# Patient Record
Sex: Female | Born: 1945 | Race: White | Hispanic: No | Marital: Married | Smoking: Never smoker
Health system: Southern US, Community
[De-identification: ages and names within clinical notes are randomized; demographics above are authoritative.]

## PROBLEM LIST (undated history)

## (undated) DIAGNOSIS — L570 Actinic keratosis: Secondary | ICD-10-CM

## (undated) DIAGNOSIS — I10 Essential (primary) hypertension: Secondary | ICD-10-CM

## (undated) DIAGNOSIS — K219 Gastro-esophageal reflux disease without esophagitis: Secondary | ICD-10-CM

## (undated) DIAGNOSIS — F419 Anxiety disorder, unspecified: Secondary | ICD-10-CM

## (undated) DIAGNOSIS — M810 Age-related osteoporosis without current pathological fracture: Secondary | ICD-10-CM

## (undated) DIAGNOSIS — M1990 Unspecified osteoarthritis, unspecified site: Secondary | ICD-10-CM

## (undated) HISTORY — PX: BREAST BIOPSY: SHX20

## (undated) HISTORY — DX: Essential (primary) hypertension: I10

## (undated) HISTORY — PX: COLONOSCOPY: SHX174

## (undated) HISTORY — DX: Actinic keratosis: L57.0

## (undated) HISTORY — PX: ABDOMINAL HYSTERECTOMY: SHX81

---

## 2007-11-14 ENCOUNTER — Ambulatory Visit: Payer: Self-pay | Admitting: Unknown Physician Specialty

## 2007-11-22 ENCOUNTER — Other Ambulatory Visit: Payer: Self-pay

## 2007-11-22 ENCOUNTER — Observation Stay: Payer: Self-pay | Admitting: Unknown Physician Specialty

## 2008-03-03 ENCOUNTER — Ambulatory Visit: Payer: Self-pay | Admitting: Urology

## 2008-05-08 ENCOUNTER — Ambulatory Visit: Payer: Self-pay | Admitting: Internal Medicine

## 2008-08-05 ENCOUNTER — Ambulatory Visit: Payer: Self-pay | Admitting: Unknown Physician Specialty

## 2008-12-22 ENCOUNTER — Ambulatory Visit: Payer: Self-pay | Admitting: Internal Medicine

## 2010-02-15 ENCOUNTER — Ambulatory Visit: Payer: Self-pay

## 2011-02-17 ENCOUNTER — Ambulatory Visit: Payer: Self-pay | Admitting: Nurse Practitioner

## 2011-06-28 ENCOUNTER — Ambulatory Visit: Payer: Self-pay | Admitting: Internal Medicine

## 2011-11-14 ENCOUNTER — Ambulatory Visit: Payer: Self-pay | Admitting: Unknown Physician Specialty

## 2012-02-22 ENCOUNTER — Ambulatory Visit: Payer: Self-pay | Admitting: Nurse Practitioner

## 2013-02-22 ENCOUNTER — Ambulatory Visit: Payer: Self-pay

## 2013-07-18 ENCOUNTER — Telehealth: Payer: Self-pay | Admitting: *Deleted

## 2013-07-18 NOTE — Telephone Encounter (Signed)
SHELLY SPOKE WITH PT THIS AM RE: HER ORTHOTICS. SHE IS STATING SHE WANTS THEM NOW OR SEE A DIFFERENT DOCTOR TO PICK THEM UP. IT IS HER 1ST PAIR AND SHE DOES HAVE AN APPT WITH YOU ON 12.4.14. WOULD YOU LIKE FOR HER TO WAIT ON YOU OR SEE A DIFF DOCTOR TO PICK THEM UP OR JUST COME IN TO PICK THEM UP WITHOUT SEEING ANYONE?

## 2013-07-19 NOTE — Telephone Encounter (Signed)
L/m on v/m letting pt know per dr Irving Shows can come by and pick up orthotics or if need an appt today would have to see dr.regal for dispense of orthotics

## 2013-07-30 ENCOUNTER — Encounter: Payer: Self-pay | Admitting: Podiatrist

## 2013-08-15 ENCOUNTER — Ambulatory Visit: Payer: Self-pay | Admitting: Podiatrist

## 2013-12-05 ENCOUNTER — Ambulatory Visit: Payer: Self-pay | Admitting: Surgery

## 2013-12-05 DIAGNOSIS — I10 Essential (primary) hypertension: Secondary | ICD-10-CM

## 2013-12-17 ENCOUNTER — Ambulatory Visit: Payer: Self-pay | Admitting: Surgery

## 2014-03-27 ENCOUNTER — Ambulatory Visit: Payer: Self-pay | Admitting: Nurse Practitioner

## 2014-12-27 NOTE — Op Note (Signed)
PATIENT NAME:  Gabriela Cohen, Gabriela Cohen MR#:  [id number] DATE OF BIRTH:  Oct 24, 1945  DATE OF PROCEDURE:  12/17/2013  PREOPERATIVE DIAGNOSIS: Left inguinal hernia.   POSTOPERATIVE DIAGNOSIS: Left inguinal hernia.   PROCEDURE: Left inguinal hernia repair.   SURGEON: Rochel Brome, M.D.   ANESTHESIA: General.   INDICATIONS: This 69 year old female has had bulging in the left groin. A left inguinal hernia was demonstrated on physical exam and repair was recommended for definitive treatment.   DESCRIPTION OF PROCEDURE: The patient was placed on the operating table in the supine position under general anesthesia. The abdomen was clipped and prepared with ChloraPrep and draped in a sterile manner. A left lower quadrant suprapubic transversely oriented incision was made, carried down through subcutaneous tissues. Two traversing veins were divided between 4-0 chromic suture ligatures and the Scarpa's fascia was incised. Several small bleeding points were cauterized. The external oblique aponeurosis was incised along the course of its fibers to reveal an indirect hernia sac, which was somewhat fatty and was dissected free from surrounding structures. There was a remnant of a round ligament, divided. The hernia sac was dissected up through the internal ring. It was opened. Its continuity with the peritoneal cavity was demonstrated. A high ligation of the sac was done with a 0 Surgilon suture ligature. The sac was excised and did not need to be sent for pathology. The stump was allowed to retract. The repair was carried out with a row of 0 Surgilon sutures, suturing the conjoined tendon to the shelving edge of the inguinal ligament, obliterating the defect. Next, an onlay Bard soft mesh was cut to create an oval shape of some 2.8 x 3.5 cm and was placed directly over the repair and sutured to the fascia with 0 Surgilon. The repair looked good. Hemostasis was intact. The external oblique aponeurosis was closed with a  running 4-0 Vicryl suture to cover the mesh.   Next, the deep fascia superior and lateral to the repair site was infiltrated with 15 mL of 0.5% Sensorcaine with epinephrine. Subcutaneous tissues were infiltrated with 5 mL. Next, the Scarpa's fascia was closed with interrupted 4-0 Vicryl. The skin was closed with running 4-0 Monocryl subcuticular suture and Dermabond. The patient tolerated surgery satisfactorily and was prepared for transfer to the recovery room. ____________________________ Lenna Sciara. Rochel Brome, MD jws:aw D: 12/17/2013 08:42:30 ET T: 12/17/2013 08:57:59 ET JOB#: [id number]  cc: Loreli Dollar, MD, <Dictator> Loreli Dollar MD ELECTRONICALLY SIGNED 12/17/2013 18:28

## 2015-03-20 ENCOUNTER — Other Ambulatory Visit: Payer: Self-pay | Admitting: Nurse Practitioner

## 2015-03-20 DIAGNOSIS — Z1231 Encounter for screening mammogram for malignant neoplasm of breast: Secondary | ICD-10-CM

## 2015-03-24 ENCOUNTER — Ambulatory Visit: Admission: RE | Admit: 2015-03-24 | Payer: Medicare Other | Source: Ambulatory Visit

## 2015-04-07 ENCOUNTER — Ambulatory Visit
Admission: RE | Admit: 2015-04-07 | Discharge: 2015-04-07 | Disposition: A | Discharge status: Home / Self Care | Payer: Medicare Other | Source: Ambulatory Visit | Attending: Nurse Practitioner | Admitting: Nurse Practitioner

## 2015-04-07 DIAGNOSIS — Z1231 Encounter for screening mammogram for malignant neoplasm of breast: Secondary | ICD-10-CM | POA: Insufficient documentation

## 2016-04-11 ENCOUNTER — Other Ambulatory Visit: Payer: Self-pay | Admitting: Nurse Practitioner

## 2016-04-11 DIAGNOSIS — Z1231 Encounter for screening mammogram for malignant neoplasm of breast: Secondary | ICD-10-CM

## 2016-04-14 ENCOUNTER — Other Ambulatory Visit: Payer: Self-pay | Admitting: Nurse Practitioner

## 2016-04-14 ENCOUNTER — Ambulatory Visit
Admission: RE | Admit: 2016-04-14 | Discharge: 2016-04-14 | Disposition: A | Discharge status: Home / Self Care | Payer: Medicare Other | Source: Ambulatory Visit | Attending: Nurse Practitioner | Admitting: Nurse Practitioner

## 2016-04-14 DIAGNOSIS — Z1231 Encounter for screening mammogram for malignant neoplasm of breast: Secondary | ICD-10-CM | POA: Insufficient documentation

## 2017-03-22 ENCOUNTER — Other Ambulatory Visit: Payer: Self-pay | Admitting: Nurse Practitioner

## 2017-03-22 DIAGNOSIS — Z1231 Encounter for screening mammogram for malignant neoplasm of breast: Secondary | ICD-10-CM

## 2017-04-24 ENCOUNTER — Ambulatory Visit
Admission: RE | Admit: 2017-04-24 | Discharge: 2017-04-24 | Disposition: A | Discharge status: Home / Self Care | Payer: Medicare Other | Source: Ambulatory Visit | Attending: Nurse Practitioner | Admitting: Nurse Practitioner

## 2017-04-24 DIAGNOSIS — Z1231 Encounter for screening mammogram for malignant neoplasm of breast: Secondary | ICD-10-CM | POA: Insufficient documentation

## 2018-03-21 ENCOUNTER — Other Ambulatory Visit: Payer: Self-pay | Admitting: Family Medicine

## 2018-03-21 DIAGNOSIS — Z1231 Encounter for screening mammogram for malignant neoplasm of breast: Secondary | ICD-10-CM

## 2018-04-25 ENCOUNTER — Ambulatory Visit
Admission: RE | Admit: 2018-04-25 | Discharge: 2018-04-25 | Disposition: A | Discharge status: Home / Self Care | Payer: Medicare HMO | Source: Ambulatory Visit | Attending: Family Medicine | Admitting: Family Medicine

## 2018-04-25 ENCOUNTER — Encounter (INDEPENDENT_AMBULATORY_CARE_PROVIDER_SITE_OTHER): Payer: Self-pay

## 2018-04-25 DIAGNOSIS — Z1231 Encounter for screening mammogram for malignant neoplasm of breast: Secondary | ICD-10-CM

## 2018-11-08 ENCOUNTER — Ambulatory Visit: Payer: Self-pay | Admitting: Urology

## 2019-01-21 ENCOUNTER — Encounter: Admission: RE | Payer: Self-pay | Source: Home / Self Care

## 2019-01-21 ENCOUNTER — Ambulatory Visit
Admission: RE | Admit: 2019-01-21 | Payer: Medicare HMO | Source: Home / Self Care | Admitting: Unknown Physician Specialty

## 2019-01-21 SURGERY — COLONOSCOPY WITH PROPOFOL
Anesthesia: General

## 2019-04-10 ENCOUNTER — Other Ambulatory Visit: Payer: Self-pay | Admitting: Family Medicine

## 2019-04-10 DIAGNOSIS — Z1231 Encounter for screening mammogram for malignant neoplasm of breast: Secondary | ICD-10-CM

## 2019-05-06 ENCOUNTER — Encounter (INDEPENDENT_AMBULATORY_CARE_PROVIDER_SITE_OTHER): Payer: Self-pay

## 2019-05-06 ENCOUNTER — Other Ambulatory Visit: Payer: Self-pay

## 2019-05-06 ENCOUNTER — Ambulatory Visit
Admission: RE | Admit: 2019-05-06 | Discharge: 2019-05-06 | Disposition: A | Discharge status: Home / Self Care | Payer: Medicare HMO | Source: Ambulatory Visit | Attending: Family Medicine | Admitting: Family Medicine

## 2019-05-06 DIAGNOSIS — Z1231 Encounter for screening mammogram for malignant neoplasm of breast: Secondary | ICD-10-CM

## 2019-06-12 ENCOUNTER — Encounter: Payer: Self-pay | Admitting: *Deleted

## 2020-04-09 ENCOUNTER — Other Ambulatory Visit: Payer: Self-pay | Admitting: Family Medicine

## 2020-04-09 DIAGNOSIS — Z1231 Encounter for screening mammogram for malignant neoplasm of breast: Secondary | ICD-10-CM

## 2020-04-21 ENCOUNTER — Ambulatory Visit
Payer: Medicare HMO | Attending: Student in an Organized Health Care Education/Training Program | Admitting: Student in an Organized Health Care Education/Training Program

## 2020-04-21 ENCOUNTER — Encounter: Payer: Self-pay | Admitting: Student in an Organized Health Care Education/Training Program

## 2020-04-21 ENCOUNTER — Other Ambulatory Visit: Payer: Self-pay

## 2020-04-21 VITALS — BP 128/76 | HR 78 | Temp 97.4°F | Resp 18 | Ht 65.0 in | Wt 140.0 lb

## 2020-04-21 DIAGNOSIS — M5136 Other intervertebral disc degeneration, lumbar region: Secondary | ICD-10-CM | POA: Insufficient documentation

## 2020-04-21 DIAGNOSIS — G8929 Other chronic pain: Secondary | ICD-10-CM | POA: Insufficient documentation

## 2020-04-21 DIAGNOSIS — M5416 Radiculopathy, lumbar region: Secondary | ICD-10-CM | POA: Insufficient documentation

## 2020-04-21 DIAGNOSIS — G894 Chronic pain syndrome: Secondary | ICD-10-CM | POA: Diagnosis present

## 2020-04-21 DIAGNOSIS — M47816 Spondylosis without myelopathy or radiculopathy, lumbar region: Secondary | ICD-10-CM | POA: Diagnosis present

## 2020-04-21 NOTE — Progress Notes (Signed)
Patient: Gabriela Cohen  Service Category: E/M  Provider: Gillis Santa, MD  DOB: 08/16/1946  DOS: 04/21/2020  Referring Provider: Nadene Rubins, DO  MRN: [id number]  Setting: Ambulatory outpatient  PCP: Alfonse Flavors, MD  Type: New Patient  Specialty: Interventional Pain Management    Location: Office  Delivery: Face-to-face     Primary Reason(s) for Visit: Encounter for initial evaluation of one or more chronic problems (new to examiner) potentially causing chronic pain, and posing a threat to normal musculoskeletal function. (Level of risk: High) CC: Back Pain (low)  HPI  [name] is a 74 y.o. year old, female patient, who comes for the first time to our practice referred by Nadene Rubins, DO [place],  [place] [address], for our initial evaluation of her chronic pain. She has Chronic radicular lumbar pain; Lumbar spondylosis; Lumbar degenerative disc disease; and Chronic pain syndrome on their problem list. Today she comes in for evaluation of her Back Pain (low)  Pain Assessment: Location: Lower Back Radiating: starts at waist and goes up to neck at times Onset: More than a month ago Duration: Chronic pain Quality: Sharp, Aching, Heaviness Severity: 6 /10 (subjective, self-reported pain score)  Effect on ADL: limits activities Timing: Constant Modifying factors: laying down BP: 128/76   HR: 78  Onset and Duration: Present longer than 3 months Cause of pain: Unknown Severity: No change since onset, NAS-11 at its worse: 10/10, NAS-11 at its best: 5/10, NAS-11 now: 6/10 and NAS-11 on the average: 8/10 Timing: Afternoon, Night, Not influenced by the time of the day, During activity or exercise, After activity or exercise and After a period of immobility Aggravating Factors: Bending, Climbing, Kneeling, Lifiting, Motion, Prolonged standing, Squatting, Twisting, Walking, Walking uphill, Walking downhill and Working Alleviating Factors: Lying down,  Medications, Nerve blocks and Resting Associated Problems: Night-time cramps and Fatigue Quality of Pain: Aching, Agonizing, Constant, Disabling, Exhausting, Getting longer, Throbbing, Tiring and Uncomfortable Previous Examinations or Tests: MRI scan, Nerve block and X-rays Previous Treatments: Facet blocks, Steroid treatments by mouth and Trigger point injections  Gabriela Cohen is a pleasant 74 year old female who presents with a chief complaint of low back and mid back pain that radiates superiorly.  This started after she fell last year when she tripped over a dog.  She went to [facility] where she received cortisone injections in her back.  She states that she had x-rays of her thoracic and lumbar spine done that showed severe scoliosis and severe arthritis.  She states that she also had advanced imaging done which showed a pubic symphysis fracture.  She has had what sounds like epidural steroid injections, lumbar facet medial branch nerve blocks followed subsequently by a lumbar radiofrequency ablation that was performed in March 2021 with Dr. Dwain Sarna.  Unfortunately we do not have any records from [facility] pertaining to her imaging studies or procedural history.  She states that she does receive tramadol from [facility] which she takes on a as needed basis when she has severe pain.  Dr. Cristy Folks who is managing her pain at [facility] has moved she would like to transfer her care here.  Patient also endorses intermittent right knee pain for which she has received a right cortisone injection.  Historic Controlled Substance Pharmacotherapy Review   03/23/2020  1   03/23/2020  Tramadol Hcl 50 MG Tablet  50.00  16 [address] [place]   [id number]   [place] (4575)   0/0  15.62 MME  Medicare  [place]     Tramadol 25 to 50 mg daily as needed  Historical Monitoring: The patient  reports no history of drug use. List of all UDS Test(s): No results found for: MDMA, COCAINSCRNUR, [place], [place], CANNABQUANT, THCU,  [place] List of other Serum/Urine Drug Screening Test(s):  No results found for: AMPHSCRSER, BARBSCRSER, BENZOSCRSER, COCAINSCRSER, COCAINSCRNUR, PCPSCRSER, PCPQUANT, THCSCRSER, THCU, CANNABQUANT, OPIATESCRSER, OXYSCRSER, PROPOXSCRSER, ETH Historical Background Evaluation: [place] PMP: PDMP reviewed during this encounter. Online review of the past 2-monthperiod conducted.             [place] Department of public safety, offender search: (Editor, commissioningInformation) Non-contributory Risk Assessment Profile: Aberrant behavior: None observed or detected today Risk factors for fatal opioid overdose: None identified today Fatal overdose hazard ratio (HR): Calculation deferred Non-fatal overdose hazard ratio (HR): Calculation deferred Risk of opioid abuse or dependence: 0.7-3.0% with doses ? 36 MME/day and 6.1-26% with doses ? 120 MME/day. Substance use disorder (SUD) risk level: See below Personal History of Substance Abuse (SUD-Substance use disorder):  Alcohol: Negative  Illegal Drugs: Negative  Rx Drugs: Negative  ORT Risk Level calculation: Low Risk  Opioid Risk Tool - 04/21/20 0857      Family History of Substance Abuse   Alcohol Negative    Illegal Drugs Negative    Rx Drugs Negative      Personal History of Substance Abuse   Alcohol Negative    Illegal Drugs Negative    Rx Drugs Negative      Age   Age between 166-45years  No      History of Preadolescent Sexual Abuse   History of Preadolescent Sexual Abuse Negative or Female      Psychological Disease   Psychological Disease Negative    Depression Negative      Total Score   Opioid Risk Tool Scoring 0    Opioid Risk Interpretation Low Risk          ORT Scoring interpretation table:  Score <3 = Low Risk for SUD  Score between 4-7 = Moderate Risk for SUD  Score >8 = High Risk for Opioid Abuse   PHQ-2 Depression Scale:  Total score: 0  PHQ-2 Scoring interpretation table: (Score and probability of major depressive disorder)  Score 0 =  No depression  Score 1 = 15.4% Probability  Score 2 = 21.1% Probability  Score 3 = 38.4% Probability  Score 4 = 45.5% Probability  Score 5 = 56.4% Probability  Score 6 = 78.6% Probability   PHQ-9 Depression Scale:  Total score: 0  PHQ-9 Scoring interpretation table:  Score 0-4 = No depression  Score 5-9 = Mild depression  Score 10-14 = Moderate depression  Score 15-19 = Moderately severe depression  Score 20-27 = Severe depression (2.4 times higher risk of SUD and 2.89 times higher risk of overuse)   Pharmacologic Plan: As per protocol, I have not taken over any controlled substance management, pending the results of ordered tests and/or consults.            Initial impression: Pending review of available data and ordered tests.  Meds   Current Outpatient Medications:    diclofenac (VOLTAREN) 75 MG EC tablet, Take 75 mg by mouth at bedtime., Disp: , Rfl:    omeprazole (PRILOSEC) 20 MG capsule, Take by mouth., Disp: , Rfl:    valsartan (DIOVAN) 160 MG tablet, Take 160 mg by mouth daily., Disp: , Rfl:    ROS  Cardiovascular: High blood pressure Pulmonary or  Respiratory: Snoring  Neurological: No reported neurological signs or symptoms such as seizures, abnormal skin sensations, urinary and/or fecal incontinence, being born with an abnormal open spine and/or a tethered spinal cord Psychological-Psychiatric: No reported psychological or psychiatric signs or symptoms such as difficulty sleeping, anxiety, depression, delusions or hallucinations (schizophrenial), mood swings (bipolar disorders) or suicidal ideations or attempts Gastrointestinal: No reported gastrointestinal signs or symptoms such as vomiting or evacuating blood, reflux, heartburn, alternating episodes of diarrhea and constipation, inflamed or scarred liver, or pancreas or irrregular and/or infrequent bowel movements Genitourinary: Recurrent Urinary Tract infections Hematological: Brusing easily Endocrine: No reported  endocrine signs or symptoms such as high or low blood sugar, rapid heart rate due to high thyroid levels, obesity or weight gain due to slow thyroid or thyroid disease Rheumatologic: Joint aches and or swelling due to excess weight (Osteoarthritis) and Rheumatoid arthritis Musculoskeletal: Negative for myasthenia gravis, muscular dystrophy, multiple sclerosis or malignant hyperthermia Work History: Working part time  Allergies  Ms. Beth is allergic to oxycodone-acetaminophen and sulfa antibiotics.  Laboratory Chemistry Profile   Renal No results found for: BUN, CREATININE, LABCREA, BCR, GFR, GFRAA, GFRNONAA, SPECGRAV, PHUR, PROTEINUR   Electrolytes No results found for: NA, K, CL, CALCIUM, MG, PHOS   Hepatic No results found for: AST, ALT, ALBUMIN, ALKPHOS, AMYLASE, LIPASE, AMMONIA   ID No results found for: LYMEIGGIGMAB, HIV, SARSCOV2NAA, STAPHAUREUS, MRSAPCR, HCVAB, PREGTESTUR, RMSFIGG, QFVRPH1IGG, QFVRPH2IGG, LYMEIGGIGMAB   Bone No results found for: VD25OH, VO160VP7TGG, YI9485IO2, VO3500XF8, 25OHVITD1, 25OHVITD2, 25OHVITD3, TESTOFREE, TESTOSTERONE   Endocrine No results found for: GLUCOSE, GLUCOSEU, HGBA1C, TSH, FREET4, TESTOFREE, TESTOSTERONE, SHBG, ESTRADIOL, ESTRADIOLPCT, ESTRADIOLFRE, LABPREG, ACTH, CRTSLPL, UCORFRPERLTR, UCORFRPERDAY, CORTISOLBASE, LABPREG   Neuropathy No results found for: VITAMINB12, FOLATE, HGBA1C, HIV   CNS No results found for: COLORCSF, APPEARCSF, RBCCOUNTCSF, WBCCSF, POLYSCSF, LYMPHSCSF, EOSCSF, PROTEINCSF, GLUCCSF, JCVIRUS, CSFOLI, IGGCSF, LABACHR, ACETBL, LABACHR, ACETBL   Inflammation (CRP: Acute   ESR: Chronic) No results found for: CRP, ESRSEDRATE, LATICACIDVEN   Rheumatology No results found for: RF, ANA, LABURIC, URICUR, LYMEIGGIGMAB, LYMEABIGMQN, HLAB27   Coagulation No results found for: INR, LABPROT, APTT, PLT, DDIMER, LABHEMA, VITAMINK1, AT3   Cardiovascular No results found for: BNP, CKTOTAL, CKMB, TROPONINI, HGB, HCT, LABVMA,  EPIRU, EPINEPH24HUR, NOREPRU, NOREPI24HUR, DOPARU, DOPAM24HRUR   Screening No results found for: SARSCOV2NAA, COVIDSOURCE, STAPHAUREUS, MRSAPCR, HCVAB, HIV, PREGTESTUR   Cancer No results found for: CEA, CA125, LABCA2   Allergens No results found for: ALMOND, APPLE, ASPARAGUS, AVOCADO, BANANA, BARLEY, BASIL, B[place]LE[place], GREENBEAN, LIMABEAN, WHITE[place], BEEFIGE, REDBEET, BLUEBERRY, BROCCOLI, CABBAGE, MELON, CARROT, CASEIN, CASHEWNUT, CAULIFLOWER, CELERY     Note: Lab results reviewed.  [place]  Drug: Ms. Franta  reports no history of drug use. Alcohol:  reports no history of alcohol use. Tobacco:  reports that she has never smoked. She has never used smokeless tobacco. Medical:  has a past medical history of Hypertension. Family: family history includes Breast cancer (age of onset: 81) in her maternal aunt and mother.  Past Surgical History:  Procedure Laterality Date   ABDOMINAL HYSTERECTOMY     BREAST BIOPSY Left    neg   Active Ambulatory Problems    Diagnosis Date Noted   Chronic radicular lumbar pain 04/21/2020   Lumbar spondylosis 04/21/2020   Lumbar degenerative disc disease 04/21/2020   Chronic pain syndrome 04/21/2020   Resolved Ambulatory Problems    Diagnosis Date Noted   No Resolved Ambulatory Problems   Past Medical History:  Diagnosis Date   Hypertension    Constitutional Exam  General  appearance: Well nourished, well developed, and well hydrated. In no apparent acute distress Vitals:   04/21/20 0847  BP: 128/76  Pulse: 78  Resp: 18  Temp: (!) 97.4 F (36.3 C)  SpO2: 98%  Weight: 140 lb (63.5 kg)  Height: _0  (1.651 m)   BMI Assessment: Estimated body mass index is 23.3 kg/m as calculated from the following:   Height as of this encounter: _1  (1.651 m).   Weight as of this encounter: 140 lb (63.5 kg).  BMI interpretation table: BMI level Category Range association with higher incidence of chronic pain  <18 kg/m2 Underweight   18.5-24.9  kg/m2 Ideal body weight   25-29.9 kg/m2 Overweight Increased incidence by 20%  30-34.9 kg/m2 Obese (Class I) Increased incidence by 68%  35-39.9 kg/m2 Severe obesity (Class II) Increased incidence by 136%  >40 kg/m2 Extreme obesity (Class III) Increased incidence by 254%   Patient's current BMI Ideal Body weight  Body mass index is 23.3 kg/m. Ideal body weight: 57 kg (125 lb 10.6 oz) Adjusted ideal body weight: 59.6 kg (131 lb 6.4 oz)   BMI Readings from Last 4 Encounters:  04/21/20 23.30 kg/m   Wt Readings from Last 4 Encounters:  04/21/20 140 lb (63.5 kg)    Psych/Mental status: Alert, oriented x 3 (person, place, & time)       Eyes: PERLA Respiratory: No evidence of acute respiratory distress  Cervical Spine Exam  Skin & Axial Inspection: No masses, redness, edema, swelling, or associated skin lesions Alignment: Symmetrical Functional ROM: Unrestricted ROM      Stability: No instability detected Muscle Tone/Strength: Functionally intact. No obvious neuro-muscular anomalies detected. Sensory (Neurological): Unimpaired Palpation: No palpable anomalies              Upper Extremity (UE) Exam    Side: Right upper extremity  Side: Left upper extremity  Skin & Extremity Inspection: Skin color, temperature, and hair growth are WNL. No peripheral edema or cyanosis. No masses, redness, swelling, asymmetry, or associated skin lesions. No contractures.  Skin & Extremity Inspection: Skin color, temperature, and hair growth are WNL. No peripheral edema or cyanosis. No masses, redness, swelling, asymmetry, or associated skin lesions. No contractures.  Functional ROM: Unrestricted ROM          Functional ROM: Unrestricted ROM          Muscle Tone/Strength: Functionally intact. No obvious neuro-muscular anomalies detected.   Muscle Tone/Strength: Functionally intact. No obvious neuro-muscular anomalies detected.  Sensory (Neurological): Unimpaired          Sensory (Neurological): Unimpaired           Palpation: No palpable anomalies              Palpation: No palpable anomalies              Provocative Test(s):  Phalen's test: deferred Tinel's test: deferred Apley's scratch test (touch opposite shoulder):  Action 1 (Across chest): deferred Action 2 (Overhead): deferred Action 3 (LB reach): deferred   Provocative Test(s):  Phalen's test: deferred Tinel's test: deferred Apley's scratch test (touch opposite shoulder):  Action 1 (Across chest): deferred Action 2 (Overhead): deferred Action 3 (LB reach): deferred    Thoracic Spine Area Exam  Skin & Axial Inspection: No masses, redness, or swelling Alignment: Symmetrical Functional ROM: Unrestricted ROM Stability: No instability detected Muscle Tone/Strength: Functionally intact. No obvious neuro-muscular anomalies detected. Sensory (Neurological): Unimpaired Muscle strength & Tone: No palpable anomalies  Lumbar Exam  Skin &  Axial Inspection: No masses, redness, or swelling Alignment: Symmetrical Functional ROM: Pain restricted ROM       Stability: No instability detected Muscle Tone/Strength: Functionally intact. No obvious neuro-muscular anomalies detected. Sensory (Neurological): Musculoskeletal pain pattern Palpation: No palpable anomalies       Provocative Tests: Hyperextension/rotation test: (+) bilaterally for facet joint pain. Lumbar quadrant test (Kemp's test): (+) bilaterally for facet joint pain. Lateral bending test: deferred today       Patrick's Maneuver: (+) for bilateral S-I arthralgia             FABER* test: (+) for bilateral S-I arthralgia             S-I anterior distraction/compression test: deferred today         S-I lateral compression test: deferred today         S-I Thigh-thrust test: deferred today         S-I Gaenslen's test: deferred today         *(Flexion, ABduction and External Rotation)  Gait & Posture Assessment  Ambulation: Unassisted Gait: Relatively normal for age and body  habitus Posture: WNL   Lower Extremity Exam    Side: Right lower extremity  Side: Left lower extremity  Stability: No instability observed          Stability: No instability observed          Skin & Extremity Inspection: Skin color, temperature, and hair growth are WNL. No peripheral edema or cyanosis. No masses, redness, swelling, asymmetry, or associated skin lesions. No contractures.  Skin & Extremity Inspection: Skin color, temperature, and hair growth are WNL. No peripheral edema or cyanosis. No masses, redness, swelling, asymmetry, or associated skin lesions. No contractures.  Functional ROM: Pain restricted ROM for knee joint          Functional ROM: Unrestricted ROM                  Muscle Tone/Strength: Functionally intact. No obvious neuro-muscular anomalies detected.  Muscle Tone/Strength: Functionally intact. No obvious neuro-muscular anomalies detected.  Sensory (Neurological): Arthropathic arthralgia        Sensory (Neurological): Unimpaired        DTR: Patellar: deferred today Achilles: deferred today Plantar: deferred today  DTR: Patellar: deferred today Achilles: deferred today Plantar: deferred today  Palpation: No palpable anomalies  Palpation: No palpable anomalies   Assessment  Primary Diagnosis & Pertinent Problem List: The primary encounter diagnosis was Lumbar radiculopathy. Diagnoses of Chronic radicular lumbar pain, Lumbar facet arthropathy, Lumbar spondylosis, Lumbar degenerative disc disease, and Chronic pain syndrome were also pertinent to this visit.  Visit Diagnosis (New problems to examiner): 1. Lumbar radiculopathy   2. Chronic radicular lumbar pain   3. Lumbar facet arthropathy   4. Lumbar spondylosis   5. Lumbar degenerative disc disease   6. Chronic pain syndrome      Plan of Care (Initial workup plan)   1.  We will obtain records from [facility] specifically imaging studies including x-rays and MRI as well as procedural history 2.  Urine  toxicology screen as below which is customary for new patients.  We will forego psych assessment as patient is on low-dose intermittent tramadol.  Can take over patient's tramadol at next visit, 50 mg daily as needed, quantity 30/month 3.  Pending imaging studies and procedural history can consider facet medial branch nerve blocks and repeat lumbar radiofrequency ablation.  Can also consider sprint peripheral nerve stimulation of lumbar medial branch 4.  Consider right knee intra-articular Synvisc versus genicular nerve block for right knee pain. 5.  In regards to medication management, I recommend patient decrease her diclofenac intake as she has been taking this daily.  Recommend that she utilize extra strength Tylenol 500 mg every 6 hours as needed.   Lab Orders     Compliance Drug Analysis, Ur     Interventional management options: Ms. Hlavac was informed that there is no guarantee that she would be a candidate for interventional therapies. The decision will be based on the results of diagnostic studies, as well as Ms. Tartaglia's risk profile.  Procedure(s) under consideration:  Repeat lumbar radiofrequency ablation Lumbar Sprint medial branch peripheral nerve stimulation SI joint injection, diagnostic Right knee steroid injection, genicular nerve block, viscosupplementation   Provider-requested follow-up: Return in about 2 weeks (around 05/05/2020) for Medication Management (get records from [facility]).  Future Appointments  Date Time Provider Patrick  05/06/2020  9:20 AM [facility] MM DEXA [facility]  05/26/2020 11:40 AM Gillis Santa, MD [facility] None    Note by: Gillis Santa, MD Date: 04/21/2020; Time: 10:12 AM

## 2020-04-21 NOTE — Patient Instructions (Addendum)
1. It is very important that we get your records from [facility]. Our staff can help you with this. We need records specifically xrays, MRIs, and any procedures that you had done including your ablation in [facility]   Pain Management Discharge Instructions  General Discharge Instructions :  If you need to reach your doctor call: Monday-Friday 8:00 am - 4:00 pm at [phone] or toll free [phone].  After clinic hours [phone] to have operator reach doctor.  Bring all of your medication bottles to all your appointments in the pain clinic.  To cancel or reschedule your appointment with Pain Management please remember to call 24 hours in advance to avoid a fee.  Refer to the educational materials which you have been given on: General Risks, I had my Procedure. Discharge Instructions, Post Sedation.  Post Procedure Instructions:  The drugs you were given will stay in your system until tomorrow, so for the next 24 hours you should not drive, make any legal decisions or drink any alcoholic beverages.  You may eat anything you prefer, but it is better to start with liquids then soups and crackers, and gradually work up to solid foods.  Please notify your doctor immediately if you have any unusual bleeding, trouble breathing or pain that is not related to your normal pain.  Depending on the type of procedure that was done, some parts of your body may feel week and/or numb.  This usually clears up by tonight or the next day.  Walk with the use of an assistive device or accompanied by an adult for the 24 hours.  You may use ice on the affected area for the first 24 hours.  Put ice in a Ziploc bag and cover with a towel and place against area 15 minutes on 15 minutes off.  You may switch to heat after 24 hours.

## 2020-04-21 NOTE — Progress Notes (Signed)
Safety precautions to be maintained throughout the outpatient stay will include: orient to surroundings, keep bed in low position, maintain call bell within reach at all times, provide assistance with transfer out of bed and ambulation.  

## 2020-04-24 LAB — COMPLIANCE DRUG ANALYSIS, UR

## 2020-04-30 ENCOUNTER — Ambulatory Visit: Payer: Medicare HMO | Admitting: Student in an Organized Health Care Education/Training Program

## 2020-05-05 ENCOUNTER — Encounter: Payer: Self-pay | Admitting: Student in an Organized Health Care Education/Training Program

## 2020-05-05 ENCOUNTER — Other Ambulatory Visit: Payer: Self-pay

## 2020-05-05 ENCOUNTER — Ambulatory Visit
Payer: Medicare HMO | Attending: Student in an Organized Health Care Education/Training Program | Admitting: Student in an Organized Health Care Education/Training Program

## 2020-05-05 VITALS — BP 150/65 | HR 74 | Temp 97.1°F | Resp 16 | Ht 65.0 in | Wt 140.0 lb

## 2020-05-05 DIAGNOSIS — M47816 Spondylosis without myelopathy or radiculopathy, lumbar region: Secondary | ICD-10-CM | POA: Diagnosis not present

## 2020-05-05 DIAGNOSIS — M5136 Other intervertebral disc degeneration, lumbar region: Secondary | ICD-10-CM

## 2020-05-05 DIAGNOSIS — G894 Chronic pain syndrome: Secondary | ICD-10-CM | POA: Insufficient documentation

## 2020-05-05 DIAGNOSIS — M170 Bilateral primary osteoarthritis of knee: Secondary | ICD-10-CM | POA: Diagnosis not present

## 2020-05-05 MED ORDER — TRAMADOL HCL 50 MG PO TABS: 50.0000 mg | ORAL_TABLET | Freq: Three times a day (TID) | ORAL | 2 refills | Status: AC | PRN

## 2020-05-05 NOTE — Progress Notes (Signed)
Safety precautions to be maintained throughout the outpatient stay will include: orient to surroundings, keep bed in low position, maintain call bell within reach at all times, provide assistance with transfer out of bed and ambulation.  

## 2020-05-05 NOTE — Patient Instructions (Addendum)
1. Sign pain contract  2. Rx for Tramadol for 3 months  3. Schedule for knee gel injection #1 ASAP 4. Schedule for bilateral L2-L5 RFA in 4-8 weeks 5 Follow up in 3 months for MM   Radiofrequency Lesioning Radiofrequency lesioning is a procedure that is performed to relieve pain. The procedure is often used for back, neck, or arm pain. Radiofrequency lesioning involves the use of a machine that creates radio waves to make heat. During the procedure, the heat is applied to the nerve that carries the pain signal. The heat damages the nerve and interferes with the pain signal. Pain relief usually starts about 2 weeks after the procedure and lasts for 6 months to 1 year. You will be awake during the procedure. You will need to be able to talk with the health care provider during the procedure. Tell a health care provider about:  Any allergies you have.  All medicines you are taking, including vitamins, herbs, eye drops, creams, and over-the-counter medicines.  Any problems you or family members have had with anesthetic medicines.  Any blood disorders you have.  Any surgeries you have had.  Any medical conditions you have or have had.  Whether you are pregnant or may be pregnant. What are the risks? Generally, this is a safe procedure. However, problems may occur, including:  Pain or soreness at the injection site.  Allergic reaction to medicines given during the procedure.  Bleeding.  Infection at the injection site.  Damage to nerves or blood vessels. What happens before the procedure? Staying hydrated Follow instructions from your health care provider about hydration, which may include:  Up to 2 hours before the procedure - you may continue to drink clear liquids, such as water, clear fruit juice, black coffee, and plain tea. Eating and drinking Follow instructions from your health care provider about eating and drinking, which may include:  8 hours before the procedure -  stop eating heavy meals or foods, such as meat, fried foods, or fatty foods.  6 hours before the procedure - stop eating light meals or foods, such as toast or cereal.  6 hours before the procedure - stop drinking milk or drinks that contain milk.  2 hours before the procedure - stop drinking clear liquids. Medicines Ask your health care provider about:  Changing or stopping your regular medicines. This is especially important if you are taking diabetes medicines or blood thinners.  Taking medicines such as aspirin and ibuprofen. These medicines can thin your blood. Do not take these medicines unless your health care provider tells you to take them.  Taking over-the-counter medicines, vitamins, herbs, and supplements. General instructions  Plan to have someone take you home from the hospital or clinic.  If you will be going home right after the procedure, plan to have someone with you for 24 hours.  Ask your health care provider what steps will be taken to help prevent infection. These may include: ? Removing hair at the procedure site. ? Washing skin with a germ-killing soap. ? Taking antibiotic medicine. What happens during the procedure?   An IV will be inserted into one of your veins.  You will be given one or more of the following: ? A medicine to help you relax (sedative). ? A medicine to numb the area (local anesthetic).  Your health care provider will insert a radiofrequency needle into the area to be treated. This is done with the help of a type of X-ray (fluoroscopy).  A  wire that carries the radio waves (electrode) will be put through the radiofrequency needle.  An electrical pulse will be sent through the electrode to verify the correct nerve that is causing your pain. You will feel a tingling sensation, and you may have muscle twitching.  The tissue around the needle tip will be heated by an electric current that comes from the radiofrequency machine. This will  numb the nerves.  The needle will be removed.  A bandage (dressing) will be put on the insertion area. The procedure may vary among health care providers and hospitals. What happens after the procedure?  Your blood pressure, heart rate, breathing rate, and blood oxygen level will be monitored until you leave the hospital or clinic.  Return to your normal activities as told by your health care provider. Ask your health care provider what activities are safe for you.  Do not drive for 24 hours if you were given a sedative during your procedure. Summary  Radiofrequency lesioning is a procedure that is performed to relieve pain. The procedure is often used for back, neck, or arm pain.  Radiofrequency lesioning involves the use of a machine that creates radio waves to make heat.  Plan to have someone take you home from the hospital or clinic. Do not drive for 24 hours if you were given a sedative during your procedure.  Return to your normal activities as told by your health care provider. Ask your health care provider what activities are safe for you. This information is not intended to replace advice given to you by your health care provider. Make sure you discuss any questions you have with your health care provider. Document Revised: 05/10/2018 Document Reviewed: 05/10/2018 Elsevier Patient Education  [place]. Sodium Hyaluronate intra-articular injection What is this medicine? SODIUM HYALURONATE (SOE dee um hye al yoor ON ate) is used to treat pain in the knee due to osteoarthritis. This medicine may be used for other purposes; ask your health care provider or pharmacist if you have questions. COMMON BRAND NAME(S): Amvisc, DUROLANE, Euflexxa, GELSYN-3, Hyalgan, Hymovis, Monovisc, Orthovisc, Supartz, Supartz FX, TriVisc, VISCO What should I tell my health care provider before I take this medicine? They need to know if you have any of these conditions:  bleeding  disorders  glaucoma  infection in the knee joint  skin conditions or sensitivity  skin infection  an unusual allergic reaction to sodium hyaluronate, other medicines, foods, dyes, or preservatives. Different brands of sodium hyaluronate contain different allergens. Some may contain egg. Talk to your doctor about your allergies to make sure that you get the right product.  pregnant or trying to get pregnant  breast-feeding How should I use this medicine? This medicine is for injection into the knee joint. It is given by a health care professional in a hospital or clinic setting. Talk to your pediatrician regarding the use of this medicine in children. Special care may be needed. Overdosage: If you think you have taken too much of this medicine contact a poison control center or emergency room at once. NOTE: This medicine is only for you. Do not share this medicine with others. What if I miss a dose? This does not apply. What may interact with this medicine? Interactions are not expected. This list may not describe all possible interactions. Give your health care provider a list of all the medicines, herbs, non-prescription drugs, or dietary supplements you use. Also tell them if you smoke, drink alcohol, or use illegal drugs.  Some items may interact with your medicine. What should I watch for while using this medicine? Tell your doctor or healthcare professional if your symptoms do not start to get better or if they get worse. If receiving this medicine for osteoarthritis, limit your activity after you receive your injection. Avoid physical activity for 48 hours following your injection to keep your knee from swelling. Do not stand on your feet for more than 1 hour at a time during the first 48 hours following your injection. Ask your doctor or healthcare professional about when you can begin major physical activity again. What side effects may I notice from receiving this medicine? Side  effects that you should report to your doctor or health care professional as soon as possible:  allergic reactions like skin rash, itching or hives, swelling of the face, lips, or tongue  dizziness  facial flushing  pain, tingling, numbness in the hands or feet  vision changes if received this medicine during eye surgery Side effects that usually do not require medical attention (report to your doctor or health care professional if they continue or are bothersome):  back pain  bruising at site where injected  chills  diarrhea  fever  headache  joint pain  joint stiffness  joint swelling  muscle cramps  muscle pain  nausea, vomiting  pain, redness, or irritation at site where injected  weak or tired This list may not describe all possible side effects. Call your doctor for medical advice about side effects. You may report side effects to FDA at 1-800-FDA-1088. Where should I keep my medicine? This drug is given in a hospital or clinic and will not be stored at home. NOTE: This sheet is a summary. It may not cover all possible information. If you have questions about this medicine, talk to your doctor, pharmacist, or health care provider.  2020 Elsevier/Gold Standard (2015-09-24 08:34:51)

## 2020-05-05 NOTE — Progress Notes (Signed)
PROVIDER NOTE: Information contained herein reflects review and annotations entered in association with encounter. Interpretation of such information and data should be left to medically-trained personnel. Information provided to patient can be located elsewhere in the medical record under "Patient Instructions". Document created using STT-dictation technology, any transcriptional errors that may result from process are unintentional.    Patient: Gabriela Cohen  Service Category: E/M  Provider: Gillis Santa, MD  DOB: 08/28/1946  DOS: 05/05/2020  Specialty: Interventional Pain Management  MRN: [id number]  Setting: Ambulatory outpatient  PCP: Alfonse Flavors, MD  Type: Established Patient    Referring Provider: Alfonse Flavors  Location: Office  Delivery: Face-to-face     Primary Reason(s) for Visit: Encounter for evaluation before starting new chronic pain management plan of care (Level of risk: moderate) CC: Back Pain (lower) and Knee Pain (right)  HPI  Gabriela Cohen is a 74 y.o. year old, female patient, who comes today for a follow-up evaluation to review the test results and decide on a treatment plan. She has Chronic radicular lumbar pain; Lumbar facet arthropathy; Lumbar degenerative disc disease; and Chronic pain syndrome on their problem list. Her primarily concern today is the Back Pain (lower) and Knee Pain (right)  Pain Assessment: Location: Lower Back Radiating: numbness in both legs Onset: More than a month ago Duration: Chronic pain Quality: Aching Severity: 7 /10 (subjective, self-reported pain score)  Effect on ADL:   Timing: Constant Modifying factors: lying down BP: (!) 150/65  HR: 74  Gabriela Cohen comes in today for a follow-up visit after her initial evaluation on 04/21/2020. Today we went over the results of her tests. These were explained in "Layman's terms". During today's appointment we went over my diagnostic impression, as well as the proposed treatment  plan.  This is this patient's second visit with me.  We have received [facility] from [facility].  Of note patient fell in March 2020 and suffered a sacral fracture and has had delayed healing and significant pain.  She also has bilateral knee osteoarthritis.  Of note she was seeing Dr. Cristy Folks at [facility] in the past.  She has had bilateral L3, L4, L5 medial branch nerve blocks which provided diagnostic insight and pain relief for 5 days.  She is status post bilateral L3, L4, L5 facet lumbar radiofrequency ablation that was performed on 10/03/2019.  This did provide her with pain relief and improvement in her functional status.  She states that the pain relief lasted approximately 6 months and now she has return of her low back pain.  She is also experiencing pain more superiorly than her previous lumbar radiofrequency region.  She does have pain with lumbar extension.  Discussed repeat lumbar radiofrequency ablation bilateral at L2, L3, L4, L5.  Risks and benefits reviewed and patient like to proceed.  She did not get benefit with prior intra-articular knee steroid injection.  Discussed intra-articular viscosupplementation with Hyalgan.  Risks and benefits reviewed and patient would like to proceed.  In regards to medication management, patient takes tramadol 50 mg 3 times daily as needed to help with her pain.  UDS up-to-date and appropriate.  We will have patient sign pain contract below and manage her tramadol.  Controlled Substance Pharmacotherapy Assessment REMS (Risk Evaluation and Mitigation Strategy)  Analgesic: Start tramadol 50 mg 3 times daily as needed.  Pill Count: None expected due to no prior prescriptions written by our practice. [name], RN  05/05/2020 11:11 AM  Sign when Signing Visit Safety  precautions to be maintained throughout the outpatient stay will include: orient to surroundings, keep bed in low position, maintain call bell within reach at all times, provide assistance  with transfer out of bed and ambulation.   Pharmacokinetics: Liberation and absorption (onset of action): WNL Distribution (time to peak effect): WNL Metabolism and excretion (duration of action): WNL         Pharmacodynamics: Desired effects: Analgesia: Gabriela Cohen reports >50% benefit. Functional ability: Patient reports that medication allows her to accomplish basic ADLs Clinically meaningful improvement in function (CMIF): Sustained CMIF goals met Perceived effectiveness: Described as relatively effective, allowing for increase in activities of daily living (ADL) Undesirable effects: Side-effects or Adverse reactions: None reported Monitoring: [place] PMP: PDMP not reviewed this encounter. Online review of the past 37-monthperiod previously conducted. Not applicable at this point since we have not taken over the patient's medication management yet. List of other Serum/Urine Drug Screening Test(s):  No results found for: AMPHSCRSER, BARBSCRSER, BENZOSCRSER, COCAINSCRSER, COCAINSCRNUR, PCPSCRSER, THCSCRSER, THCU, CANNABQUANT, [place] EMontroseList of all UDS test(s) done:  Lab Results  Component Value Date   SUMMARY Note 04/21/2020   Last UDS on record: Summary  Date Value Ref Range Status  04/21/2020 Note  Final    Comment:    ==================================================================== Compliance Drug Analysis, Ur ==================================================================== Test                             Result       Flag       Units  Drug Present and Declared for Prescription Verification   Diclofenac                     PRESENT      EXPECTED  Drug Present not Declared for Prescription Verification   Ibuprofen                      PRESENT      UNEXPECTED ==================================================================== Test                      Result    Flag   Units      Ref Range   Creatinine              97               mg/dL       >=20 ==================================================================== Declared Medications:  The flagging and interpretation on this report are based on the  following declared medications.  Unexpected results may arise from  inaccuracies in the declared medications.   **Note: The testing scope of this panel does not include small to  moderate amounts of these reported medications:   Diclofenac (Voltaren)   **Note: The testing scope of this panel does not include the  following reported medications:   Omeprazole (Prilosec)  Valsartan (Diovan) ==================================================================== For clinical consultation, please call [phone] ====================================================================    UDS interpretation: No unexpected findings.          Medication Assessment Form: Patient introduced to form today Treatment compliance: Treatment may start today if patient agrees with proposed plan. Evaluation of compliance is not applicable at this point Risk Assessment Profile: Aberrant behavior: See initial evaluations. None observed or detected today Comorbid factors increasing risk of overdose: See initial evaluation. No additional risks detected today Opioid risk tool (ORT):  Opioid Risk  04/21/2020  Alcohol 0  Illegal Drugs 0  Rx Drugs 0  Alcohol 0  Illegal Drugs 0  Rx Drugs 0  Age between 28-45 years  0  History of Preadolescent Sexual Abuse 0  Psychological Disease 0  Depression 0  Opioid Risk Tool Scoring 0  Opioid Risk Interpretation Low Risk    ORT Scoring interpretation table:  Score <3 = Low Risk for SUD  Score between 4-7 = Moderate Risk for SUD  Score >8 = High Risk for Opioid Abuse   Risk of substance use disorder (SUD): Low  Risk Mitigation Strategies:  Patient opioid safety counseling: Completed today. Counseling provided to patient as per "Patient Counseling Document". Document signed by patient, attesting  to counseling and understanding Patient-Prescriber Agreement (PPA): Obtained today.  Controlled substance notification to other providers: Written and sent today.  Pharmacologic Plan: Today we may be taking over the patient's pharmacological regimen. See below.             Laboratory Chemistry Profile   Renal No results found for: BUN, CREATININE, LABCREA, BCR, GFR, GFRAA, GFRNONAA, SPECGRAV, PHUR, PROTEINUR   Electrolytes No results found for: NA, K, CL, CALCIUM, MG, PHOS   Hepatic No results found for: AST, ALT, ALBUMIN, ALKPHOS, AMYLASE, LIPASE, AMMONIA   ID No results found for: LYMEIGGIGMAB, HIV, SARSCOV2NAA, STAPHAUREUS, MRSAPCR, HCVAB, PREGTESTUR, RMSFIGG, QFVRPH1IGG, QFVRPH2IGG, LYMEIGGIGMAB   Bone No results found for: VD25OH, FM384YK5LDJ, TT0177LT9, QZ0092ZR0, 25OHVITD1, 25OHVITD2, 25OHVITD3, TESTOFREE, TESTOSTERONE   Endocrine No results found for: GLUCOSE, GLUCOSEU, HGBA1C, TSH, FREET4, TESTOFREE, TESTOSTERONE, SHBG, ESTRADIOL, ESTRADIOLPCT, ESTRADIOLFRE, LABPREG, ACTH, CRTSLPL, UCORFRPERLTR, UCORFRPERDAY, CORTISOLBASE, LABPREG   Neuropathy No results found for: VITAMINB12, FOLATE, HGBA1C, HIV   CNS No results found for: COLORCSF, APPEARCSF, RBCCOUNTCSF, WBCCSF, POLYSCSF, LYMPHSCSF, EOSCSF, PROTEINCSF, GLUCCSF, JCVIRUS, CSFOLI, IGGCSF, LABACHR, ACETBL, LABACHR, ACETBL   Inflammation (CRP: Acute  ESR: Chronic) No results found for: CRP, ESRSEDRATE, LATICACIDVEN   Rheumatology No results found for: RF, ANA, LABURIC, URICUR, LYMEIGGIGMAB, LYMEABIGMQN, HLAB27   Coagulation No results found for: INR, LABPROT, APTT, PLT, DDIMER, LABHEMA, VITAMINK1, AT3   Cardiovascular No results found for: BNP, CKTOTAL, CKMB, TROPONINI, HGB, HCT, LABVMA, EPIRU, EPINEPH24HUR, NOREPRU, NOREPI24HUR, DOPARU, DOPAM24HRUR   Screening No results found for: SARSCOV2NAA, COVIDSOURCE, STAPHAUREUS, MRSAPCR, HCVAB, HIV, PREGTESTUR   Cancer No results found for: CEA, CA125, LABCA2    Allergens No results found for: ALMOND, APPLE, ASPARAGUS, AVOCADO, BANANA, BARLEY, BASIL, BAYLEAF, GREENBEAN, LIMABEAN, WHITEBEAN, BEEFIGE, REDBEET, BLUEBERRY, BROCCOLI, CABBAGE, MELON, CARROT, CASEIN, CASHEWNUT, CAULIFLOWER, CELERY     Note: Lab results reviewed.  Recent Diagnostic Imaging Review    Lumbar MRI 04/08/2019: Vertical fractures of sacral ala and transverse fracture consistent with H shaped fracture of sacrum.  Advanced multilevel degenerative disc disease and lumbar spondylosis, facet arthropathy resulting in multilevel bilateral foraminal and recess stenosis most pronounced at L2-L3, L3-L4, L4-L5.  Complexity Note: Imaging results reviewed. Results shared with Ms. Belenda Cruise, using Layman's terms.                         Meds   Current Outpatient Medications:  .  diclofenac (VOLTAREN) 75 MG EC tablet, Take 75 mg by mouth at bedtime., Disp: , Rfl:  .  omeprazole (PRILOSEC) 20 MG capsule, Take by mouth., Disp: , Rfl:  .  valsartan (DIOVAN) 160 MG tablet, Take 160 mg by mouth daily., Disp: , Rfl:  .  traMADol (ULTRAM) 50 MG tablet, Take 1 tablet (50 mg total) by mouth 3 (three) times daily as needed for  severe pain. Month last 30 days., Disp: 90 tablet, Rfl: 2  ROS  Constitutional: Denies any fever or chills Gastrointestinal: No reported hemesis, hematochezia, vomiting, or acute GI distress Musculoskeletal: Low back, bilateral buttock, bilateral knee pain Neurological: No reported episodes of acute onset apraxia, aphasia, dysarthria, agnosia, amnesia, paralysis, loss of coordination, or loss of consciousness  Allergies  Ms. Kongiganak is allergic to oxycodone-acetaminophen and sulfa antibiotics.  [place]  Drug: Ms. Pal  reports no history of drug use. Alcohol:  reports no history of alcohol use. Tobacco:  reports that she has never smoked. She has never used smokeless tobacco. Medical:  has a past medical history of Hypertension. Surgical: Ms. Coppens  has a past surgical  history that includes Abdominal hysterectomy and Breast biopsy (Left). Family: family history includes Breast cancer (age of onset: 47) in her maternal aunt and mother.  Constitutional Exam  General appearance: Well nourished, well developed, and well hydrated. In no apparent acute distress Vitals:   05/05/20 1107  BP: (!) 150/65  Pulse: 74  Resp: 16  Temp: (!) 97.1 F (36.2 C)  TempSrc: Temporal  SpO2: 98%  Weight: 140 lb (63.5 kg)  Height: _0  (1.651 m)   BMI Assessment: Estimated body mass index is 23.3 kg/m as calculated from the following:   Height as of this encounter: _1  (1.651 m).   Weight as of this encounter: 140 lb (63.5 kg).  BMI interpretation table: BMI level Category Range association with higher incidence of chronic pain  <18 kg/m2 Underweight   18.5-24.9 kg/m2 Ideal body weight   25-29.9 kg/m2 Overweight Increased incidence by 20%  30-34.9 kg/m2 Obese (Class I) Increased incidence by 68%  35-39.9 kg/m2 Severe obesity (Class II) Increased incidence by 136%  >40 kg/m2 Extreme obesity (Class III) Increased incidence by 254%   Patient's current BMI Ideal Body weight  Body mass index is 23.3 kg/m. Ideal body weight: 57 kg (125 lb 10.6 oz) Adjusted ideal body weight: 59.6 kg (131 lb 6.4 oz)   BMI Readings from Last 4 Encounters:  05/05/20 23.30 kg/m  04/21/20 23.30 kg/m   Wt Readings from Last 4 Encounters:  05/05/20 140 lb (63.5 kg)  04/21/20 140 lb (63.5 kg)    Psych/Mental status: Alert, oriented x 3 (person, place, & time)       Eyes: PERLA Respiratory: No evidence of acute respiratory distress   Lumbar Exam  Skin & Axial Inspection: No masses, redness, or swelling Alignment: Symmetrical Functional ROM: Pain restricted ROM       Stability: No instability detected Muscle Tone/Strength: Functionally intact. No obvious neuro-muscular anomalies detected. Sensory (Neurological): Musculoskeletal pain pattern Palpation: No palpable anomalies        Provocative Tests: Hyperextension/rotation test: (+) bilaterally for facet joint pain. Lumbar quadrant test (Kemp's test): (+) bilaterally for facet joint pain. Lateral bending test: deferred today       Patrick's Maneuver: (+) for bilateral S-I arthralgia             FABER* test: (+) for bilateral S-I arthralgia             S-I anterior distraction/compression test: deferred today         S-I lateral compression test: deferred today         S-I Thigh-thrust test: deferred today         S-I Gaenslen's test: deferred today         *(Flexion, ABduction and External Rotation)  Gait & Posture Assessment  Ambulation: Unassisted Gait: Relatively normal for age and body habitus Posture: WNL   Lower Extremity Exam    Side: Right lower extremity  Side: Left lower extremity  Stability: No instability observed          Stability: No instability observed          Skin & Extremity Inspection: Skin color, temperature, and hair growth are WNL. No peripheral edema or cyanosis. No masses, redness, swelling, asymmetry, or associated skin lesions. No contractures.  Skin & Extremity Inspection: Skin color, temperature, and hair growth are WNL. No peripheral edema or cyanosis. No masses, redness, swelling, asymmetry, or associated skin lesions. No contractures.  Functional ROM: Pain restricted ROM for knee joint          Functional ROM: Unrestricted ROM                  Muscle Tone/Strength: Functionally intact. No obvious neuro-muscular anomalies detected.  Muscle Tone/Strength: Functionally intact. No obvious neuro-muscular anomalies detected.  Sensory (Neurological): Arthropathic arthralgia        Sensory (Neurological): Unimpaired        DTR: Patellar: deferred today Achilles: deferred today Plantar: deferred today  DTR: Patellar: deferred today Achilles: deferred today Plantar: deferred today  Palpation: No palpable anomalies  Palpation: No palpable anomalies     Assessment &  Plan  Primary Diagnosis & Pertinent Problem List: The primary encounter diagnosis was Lumbar facet arthropathy. Diagnoses of Lumbar spondylosis, Lumbar degenerative disc disease, Chronic pain syndrome, and Bilateral primary osteoarthritis of knee were also pertinent to this visit.  Visit Diagnosis: 1. Lumbar facet arthropathy   2. Lumbar spondylosis   3. Lumbar degenerative disc disease   4. Chronic pain syndrome   5. Bilateral primary osteoarthritis of knee    Problems updated and reviewed during this visit: Problem  Lumbar Facet Arthropathy   1.  Lumbar facet arthropathy status post lumbar radiofrequency ablation January 2021 bilaterally at L3, L4, L5.  Patient endorsed approximately 6 months of pain relief and improvement in functional status now with return of her pain.  Discussed repeating lumbar radiofrequency ablation.  Future considerations could include sprint peripheral nerve stimulation of lumbar medial branch if the patient does not receive satisfactory analgesic/functional benefit after next RFA. 2.  UDS appropriate.  Patient will sign pain contract.  We will take over her tramadol as below, 50 mg 3 times daily as needed. 3.  Bilateral knee osteoarthritis.  Has failed intra-articular knee steroid injections.  Plan for intra-articular bilateral Hyalgan therapy.  Discussed series.  We will plan for 1 initially.  Then reevaluate.   Plan of Care  Pharmacotherapy (Medications Ordered): Meds ordered this encounter  Medications  . traMADol (ULTRAM) 50 MG tablet    Sig: Take 1 tablet (50 mg total) by mouth 3 (three) times daily as needed for severe pain. Month last 30 days.    Dispense:  90 tablet    Refill:  2    [place] STOP ACT - Not applicable. Fill one day early if pharmacy is closed on scheduled refill date.    Procedure Orders     Radiofrequency,Lumbar     KNEE INJECTION   Plan for bilateral knee intra-articular Hyalgan in 1 to 2 weeks Plan for repeat lumbar radiofrequency  ablation bilaterally at L2, L3, L4, L5.  Patient previously had these done in [place] with [facility], last January which did provide pain relief and functional benefit for approximately 6 months.  At that time, she had levels  L3, L4, L5 done.  She has pain more superiorly so recommend adding L2 as well. Follow-up in 3 months for medication management.  Prescription for tramadol as above.  Patient to sign pain contract.  Pharmacological management options:  Opioid Analgesics: We'll take over management today. See above orders Membrane stabilizer: None prescribed at this time Muscle relaxant: Options discussed, Ms. Purk would prefer not taking any NSAID: Trial discussed. Other analgesic(s): To be determined at a later time    Interventional management options: Planned, scheduled, and/or pending:    Bilateral knee Hyalgan injection Bilateral L2-L5 lumbar radiofrequency ablation   Considering:   Sprint peripheral nerve stimulation   PRN Procedures:   None at this time    Provider-requested follow-up: Return in about 2 weeks (around 05/19/2020) for knee hyalgan #1 . Recent Visits Date Type Provider Dept  04/21/20 Office Visit Gillis Santa, MD [facility]  Showing recent visits within past 90 days and meeting all other requirements Today's Visits Date Type Provider Dept  05/05/20 Office Visit Gillis Santa, MD [facility]  Showing today's visits and meeting all other requirements Future Appointments No visits were found meeting these conditions. Showing future appointments within next 90 days and meeting all other requirements  Primary Care Physician: Zhou-Talbert, Elwyn Lade, MD Note by: Gillis Santa, MD Date: 05/05/2020; Time: 11:52 AM

## 2020-05-06 ENCOUNTER — Ambulatory Visit: Payer: Medicare HMO

## 2020-05-12 ENCOUNTER — Telehealth: Payer: Self-pay

## 2020-05-12 NOTE — Telephone Encounter (Signed)
Her insurance company has issued an unable to approve for the RFA. Do you want me to set up a peer to peer? If so when would be a good time?

## 2020-05-12 NOTE — Telephone Encounter (Signed)
Peer to Peer set up for tomorrow, Wednesday, at [place]. They will call your cell.

## 2020-05-12 NOTE — Telephone Encounter (Signed)
Wed or Thursday at 8 am would be nice. Please give them my cell phone at [phone].  Thanks BL

## 2020-05-19 ENCOUNTER — Ambulatory Visit
Admission: RE | Admit: 2020-05-19 | Discharge: 2020-05-19 | Disposition: A | Discharge status: Home / Self Care | Payer: Medicare HMO | Source: Ambulatory Visit | Attending: Family Medicine | Admitting: Family Medicine

## 2020-05-19 ENCOUNTER — Other Ambulatory Visit: Payer: Self-pay

## 2020-05-19 DIAGNOSIS — Z1231 Encounter for screening mammogram for malignant neoplasm of breast: Secondary | ICD-10-CM | POA: Insufficient documentation

## 2020-05-20 ENCOUNTER — Other Ambulatory Visit: Payer: Self-pay

## 2020-05-26 ENCOUNTER — Ambulatory Visit: Payer: Medicare HMO | Admitting: Student in an Organized Health Care Education/Training Program

## 2020-05-27 ENCOUNTER — Ambulatory Visit: Payer: Medicare HMO | Admitting: Student in an Organized Health Care Education/Training Program

## 2020-06-01 ENCOUNTER — Other Ambulatory Visit: Payer: Self-pay

## 2020-06-01 ENCOUNTER — Ambulatory Visit
Payer: Medicare HMO | Attending: Student in an Organized Health Care Education/Training Program | Admitting: Student in an Organized Health Care Education/Training Program

## 2020-06-01 ENCOUNTER — Encounter: Payer: Self-pay | Admitting: Student in an Organized Health Care Education/Training Program

## 2020-06-01 VITALS — BP 149/64 | HR 83 | Temp 98.1°F | Resp 18 | Ht 65.0 in | Wt 140.0 lb

## 2020-06-01 DIAGNOSIS — M170 Bilateral primary osteoarthritis of knee: Secondary | ICD-10-CM | POA: Diagnosis not present

## 2020-06-01 DIAGNOSIS — G894 Chronic pain syndrome: Secondary | ICD-10-CM

## 2020-06-01 MED ORDER — LIDOCAINE HCL 2 % IJ SOLN
20.0000 mL | Freq: Once | INTRAMUSCULAR | Status: AC
  Administered 2020-06-01: 400 mg

## 2020-06-01 MED ORDER — LIDOCAINE HCL 2 % IJ SOLN
INTRAMUSCULAR | Status: AC
  Filled 2020-06-01: qty 20

## 2020-06-01 MED ORDER — SODIUM HYALURONATE (VISCOSUP) 20 MG/2ML IX SOSY
2.0000 mL | PREFILLED_SYRINGE | Freq: Once | INTRA_ARTICULAR | Status: AC
  Administered 2020-06-01: 2 mL via INTRA_ARTICULAR

## 2020-06-01 NOTE — Progress Notes (Signed)
PROVIDER NOTE: Information contained herein reflects review and annotations entered in association with encounter. Interpretation of such information and data should be left to medically-trained personnel. Information provided to patient can be located elsewhere in the medical record under "Patient Instructions". Document created using STT-dictation technology, any transcriptional errors that may result from process are unintentional.    Patient: Gabriela Cohen  Service Category: Procedure  Provider: Gillis Santa, MD  DOB: [birth year]  DOS: 06/01/2020  Location: [facility]  MRN: [id number]  Setting: Ambulatory - outpatient  Referring Provider: Gillis Santa, MD  Type: Established Patient  Specialty: Interventional Pain Management  PCP: Alfonse Flavors, MD   Primary Reason for Visit: Interventional Pain Management Treatment. CC: Knee Pain (bilateral)  Procedure:          Anesthesia, Analgesia, Anxiolysis:  Type: Diagnostic Intra-Articular Hyalgan Knee Injection #1  Region: Medial infrapatellar Knee Region Level: Knee Joint Laterality: Bilateral  Type: Local Anesthesia Indication(s): Analgesia         Local Anesthetic: Lidocaine 1-2% Route: Infiltration ([place]/IM) IV Access: Declined Sedation: Declined   Position: Sitting   Indications: 1. Bilateral primary osteoarthritis of knee   2. Chronic pain syndrome    Pain Score: Pre-procedure: 8 /10 Post-procedure: 8 /10   Pre-op Assessment:  Gabriela Cohen is a 74 y.o. (year old), female patient, seen today for interventional treatment. She  has a past surgical history that includes Abdominal hysterectomy and Breast biopsy (Left). Gabriela Cohen has a current medication list which includes the following prescription(s): omeprazole, tramadol, valsartan, and diclofenac. Her primarily concern today is the Knee Pain (bilateral)  Initial Vital Signs:  Pulse/HCG Rate: 83  Temp: 98.1 F (36.7 C) Resp: 18 BP: (!) 149/64 SpO2: 98  %  BMI: Estimated body mass index is 23.3 kg/m as calculated from the following:   Height as of this encounter: 5\' 5"  (1.651 m).   Weight as of this encounter: 140 lb (63.5 kg).  Risk Assessment: Allergies: Reviewed. She is allergic to oxycodone-acetaminophen and sulfa antibiotics.  Allergy Precautions: None required Coagulopathies: Reviewed. None identified.  Blood-thinner therapy: None at this time Active Infection(s): Reviewed. None identified. Gabriela Cohen is afebrile  Site Confirmation: Gabriela Cohen was asked to confirm the procedure and laterality before marking the site Procedure checklist: Completed Consent: Before the procedure and under the influence of no sedative(s), amnesic(s), or anxiolytics, the patient was informed of the treatment options, risks and possible complications. To fulfill our ethical and legal obligations, as recommended by the American Medical Association's Code of Ethics, I have informed the patient of my clinical impression; the nature and purpose of the treatment or procedure; the risks, benefits, and possible complications of the intervention; the alternatives, including doing nothing; the risk(s) and benefit(s) of the alternative treatment(s) or procedure(s); and the risk(s) and benefit(s) of doing nothing. The patient was provided information about the general risks and possible complications associated with the procedure. These may include, but are not limited to: failure to achieve desired goals, infection, bleeding, organ or nerve damage, allergic reactions, paralysis, and death. In addition, the patient was informed of those risks and complications associated to the procedure, such as failure to decrease pain; infection; bleeding; organ or nerve damage with subsequent damage to sensory, motor, and/or autonomic systems, resulting in permanent pain, numbness, and/or weakness of one or several areas of the body; allergic reactions; (i.e.: anaphylactic reaction);  and/or death. Furthermore, the patient was informed of those risks and complications associated with the medications.  These include, but are not limited to: allergic reactions (i.e.: anaphylactic or anaphylactoid reaction(s)); adrenal axis suppression; blood sugar elevation that in diabetics may result in ketoacidosis or comma; water retention that in patients with history of congestive heart failure may result in shortness of breath, pulmonary edema, and decompensation with resultant heart failure; weight gain; swelling or edema; medication-induced neural toxicity; particulate matter embolism and blood vessel occlusion with resultant organ, and/or nervous system infarction; and/or aseptic necrosis of one or more joints. Finally, the patient was informed that Medicine is not an exact science; therefore, there is also the possibility of unforeseen or unpredictable risks and/or possible complications that may result in a catastrophic outcome. The patient indicated having understood very clearly. We have given the patient no guarantees and we have made no promises. Enough time was given to the patient to ask questions, all of which were answered to the patient's satisfaction. Gabriela Cohen has indicated that she wanted to continue with the procedure. Attestation: I, the ordering provider, attest that I have discussed with the patient the benefits, risks, side-effects, alternatives, likelihood of achieving goals, and potential problems during recovery for the procedure that I have provided informed consent. Date  Time: 06/01/2020 10:51 AM  Pre-Procedure Preparation:  Monitoring: As per clinic protocol. Respiration, ETCO2, SpO2, BP, heart rate and rhythm monitor placed and checked for adequate function Safety Precautions: Patient was assessed for positional comfort and pressure points before starting the procedure. Time-out: I initiated and conducted the "Time-out" before starting the procedure, as per protocol.  The patient was asked to participate by confirming the accuracy of the "Time Out" information. Verification of the correct person, site, and procedure were performed and confirmed by me, the nursing staff, and the patient. "Time-out" conducted as per Joint Commission's Universal Protocol (UP.01.01.01). Time: 1117  Description of Procedure:          Target Area: Knee Joint Approach: Just above the Medial tibial plateau, lateral to the infrapatellar tendon. Area Prepped: Entire knee area, from the mid-thigh to the mid-shin. DuraPrep (Iodine Povacrylex [0.7% available iodine] and Isopropyl Alcohol, 74% w/w) Safety Precautions: Aspiration looking for blood return was conducted prior to all injections. At no point did we inject any substances, as a needle was being advanced. No attempts were made at seeking any paresthesias. Safe injection practices and needle disposal techniques used. Medications properly checked for expiration dates. SDV (single dose vial) medications used. Description of the Procedure: Protocol guidelines were followed. The patient was placed in position over the fluoroscopy table. The target area was identified and the area prepped in the usual manner. Skin & deeper tissues infiltrated with local anesthetic. Appropriate amount of time allowed to pass for local anesthetics to take effect. The procedure needles were then advanced to the target area. Proper needle placement secured. Negative aspiration confirmed. Solution injected in intermittent fashion, asking for systemic symptoms every 0.5cc of injectate. The needles were then removed and the area cleansed, making sure to leave some of the prepping solution back to take advantage of its long term bactericidal properties. Vitals:   06/01/20 1055 06/01/20 1056  BP:  (!) 149/64  Pulse: 83   Resp: 18   Temp:  98.1 F (36.7 C)  SpO2: 98%   Weight: 140 lb (63.5 kg)   Height: 5\' 5"  (1.651 m)     Start Time: 1117 hrs. End Time: 1119  hrs. Materials:  Needle(s) Type: Regular needle Gauge: 25G Length: 1.5-in Medication(s): Please see orders for medications and  dosing details.  Imaging Guidance:          Type of Imaging Technique: None used Indication(s): N/A Exposure Time: No patient exposure Contrast: None used. Fluoroscopic Guidance: N/A Ultrasound Guidance: N/A Interpretation: N/A  Antibiotic Prophylaxis:   Anti-infectives (From admission, onward)   None     Indication(s): None identified  Post-operative Assessment:  Post-procedure Vital Signs:  Pulse/HCG Rate: 83  Temp: 98.1 F (36.7 C) Resp: 18 BP: (!) 149/64 SpO2: 98 %  EBL: None  Complications: No immediate post-treatment complications observed by team, or reported by patient.  Note: The patient tolerated the entire procedure well. A repeat set of vitals were taken after the procedure and the patient was kept under observation following institutional policy, for this type of procedure. Post-procedural neurological assessment was performed, showing return to baseline, prior to discharge. The patient was provided with post-procedure discharge instructions, including a section on how to identify potential problems. Should any problems arise concerning this procedure, the patient was given instructions to immediately contact us, at any time, without hesitation. In any case, we plan to contact the patient by telephone for a follow-up status report regarding this interventional procedure.  Comments:  No additional relevant information.  Plan of Care  Orders:  Orders Placed This Encounter  Procedures  . KNEE INJECTION    Hyalgan knee injection. Please order Hyalgan.    Standing Status:   Future    Standing Expiration Date:   07/01/2020    Scheduling Instructions:     Procedure: Intra-articular Hyalgan Knee injection   #2         Side: Bilateral     Sedation: None     Timeframe: in 4 weeks    Order Specific Question:   Where will this procedure  be performed?    Answer:   [facility]   Medications ordered for procedure: Meds ordered this encounter  Medications  . lidocaine (XYLOCAINE) 2 % (with pres) injection 400 mg  . Sodium Hyaluronate SOSY 2 mL  . Sodium Hyaluronate SOSY 2 mL   Medications administered: We administered lidocaine, Sodium Hyaluronate, and Sodium Hyaluronate.  See the medical record for exact dosing, route, and time of administration.  Follow-up plan:   Return in about 4 weeks (around 06/29/2020) for Bilateral knee Hyalgan No. 2.       Interventional management options: Planned, scheduled, and/or pending:    Bilateral knee Hyalgan injection 06/01/20 #1 Bilateral L2-L5 lumbar radiofrequency ablation   Considering:   Sprint peripheral nerve stimulation   PRN Procedures:   None at this time     Recent Visits Date Type Provider Dept  05/05/20 Office Visit Gillis Santa, MD [facility]  04/21/20 Office Visit Gillis Santa, MD [facility]  Showing recent visits within past 90 days and meeting all other requirements Today's Visits Date Type Provider Dept  06/01/20 Procedure visit Gillis Santa, MD [facility]  Showing today's visits and meeting all other requirements Future Appointments Date Type Provider Dept  08/04/20 Appointment Gillis Santa, MD [facility]  Showing future appointments within next 90 days and meeting all other requirements  Disposition: Discharge home  Discharge (Date  Time): 06/01/2020; 1120 hrs.   Primary Care Physician: Alfonse Flavors, MD Location: [facility] Note by: Gillis Santa, MD Date: 06/01/2020; Time: 12:36 PM  Disclaimer:  Medicine is not an exact science. The only guarantee in medicine is that nothing is guaranteed. It is important to note  that the decision to proceed with this intervention was based on the information collected from the patient. The Data and conclusions were  drawn from the patient's questionnaire, the interview, and the physical examination. Because the information was provided in large part by the patient, it cannot be guaranteed that it has not been purposely or unconsciously manipulated. Every effort has been made to obtain as much relevant data as possible for this evaluation. It is important to note that the conclusions that lead to this procedure are derived in large part from the available data. Always take into account that the treatment will also be dependent on availability of resources and existing treatment guidelines, considered by other Pain Management Practitioners as being common knowledge and practice, at the time of the intervention. For Medico-Legal purposes, it is also important to point out that variation in procedural techniques and pharmacological choices are the acceptable norm. The indications, contraindications, technique, and results of the above procedure should only be interpreted and judged by a Board-Certified Interventional Pain Specialist with extensive familiarity and expertise in the same exact procedure and technique.

## 2020-06-01 NOTE — Progress Notes (Signed)
Safety precautions to be maintained throughout the outpatient stay will include: orient to surroundings, keep bed in low position, maintain call bell within reach at all times, provide assistance with transfer out of bed and ambulation.  

## 2020-06-02 ENCOUNTER — Telehealth: Payer: Self-pay | Admitting: *Deleted

## 2020-06-02 NOTE — Telephone Encounter (Signed)
No problems post procedure. 

## 2020-06-15 ENCOUNTER — Ambulatory Visit: Payer: Medicare HMO | Admitting: Student in an Organized Health Care Education/Training Program

## 2020-06-24 ENCOUNTER — Encounter: Payer: Self-pay | Admitting: Student in an Organized Health Care Education/Training Program

## 2020-06-24 ENCOUNTER — Ambulatory Visit
Payer: Medicare HMO | Attending: Student in an Organized Health Care Education/Training Program | Admitting: Student in an Organized Health Care Education/Training Program

## 2020-06-24 ENCOUNTER — Other Ambulatory Visit: Payer: Self-pay

## 2020-06-24 VITALS — BP 148/70 | HR 70 | Temp 97.1°F | Resp 16 | Ht 65.0 in | Wt 145.0 lb

## 2020-06-24 DIAGNOSIS — M170 Bilateral primary osteoarthritis of knee: Secondary | ICD-10-CM | POA: Diagnosis present

## 2020-06-24 DIAGNOSIS — G894 Chronic pain syndrome: Secondary | ICD-10-CM

## 2020-06-24 MED ORDER — SODIUM HYALURONATE (VISCOSUP) 20 MG/2ML IX SOSY
2.0000 mL | PREFILLED_SYRINGE | Freq: Once | INTRA_ARTICULAR | Status: AC
  Administered 2020-06-24: 2 mL via INTRA_ARTICULAR

## 2020-06-24 MED ORDER — LIDOCAINE HCL 2 % IJ SOLN
20.0000 mL | Freq: Once | INTRAMUSCULAR | Status: AC
  Administered 2020-06-24: 400 mg

## 2020-06-24 MED ORDER — CELECOXIB 100 MG PO CAPS: 100.0000 mg | ORAL_CAPSULE | Freq: Every day | ORAL | 1 refills | Status: AC | PRN

## 2020-06-24 MED ORDER — LIDOCAINE HCL 2 % IJ SOLN
INTRAMUSCULAR | Status: AC
  Filled 2020-06-24: qty 20

## 2020-06-24 MED ORDER — METHYLPREDNISOLONE 4 MG PO TBPK: ORAL_TABLET | ORAL | 0 refills | Status: AC

## 2020-06-24 NOTE — Progress Notes (Signed)
Safety precautions to be maintained throughout the outpatient stay will include: orient to surroundings, keep bed in low position, maintain call bell within reach at all times, provide assistance with transfer out of bed and ambulation.  

## 2020-06-24 NOTE — Progress Notes (Signed)
PROVIDER NOTE: Information contained herein reflects review and annotations entered in association with encounter. Interpretation of such information and data should be left to medically-trained personnel. Information provided to patient can be located elsewhere in the medical record under "Patient Instructions". Document created using STT-dictation technology, any transcriptional errors that may result from process are unintentional.    Patient: Ronnald Ramp  Service Category: Procedure  Provider: Gillis Santa, MD  DOB: June 19, 1946  DOS: 06/24/2020  Location: [facility]  MRN: [id number]  Setting: Ambulatory - outpatient  Referring Provider: Alfonse Flavors  Type: Established Patient  Specialty: Interventional Pain Management  PCP: Alfonse Flavors, MD   Primary Reason for Visit: Interventional Pain Management Treatment. CC: Knee Pain (bilat), Leg Pain (claves bilat/stiff), and Foot Pain (right side of right foot aches)  Procedure:          Anesthesia, Analgesia, Anxiolysis:  Type: Therapeutic Intra-Articular Hyalgan Knee Injection #2  Region: Medial infrapatellar Knee Region Level: Knee Joint Laterality: Bilateral  Type: Local Anesthesia Indication(s): Analgesia         Local Anesthetic: Lidocaine 1-2% Route: Infiltration ([place]/IM) IV Access: Declined Sedation: Declined   Position: Sitting   Indications: 1. Bilateral primary osteoarthritis of knee   2. Chronic pain syndrome    Pain Score: Pre-procedure: 6 /10 Post-procedure: 0-No pain/10   Pre-op Assessment:  Ms. Greif is a 74 y.o. (year old), female patient, seen today for interventional treatment. She  has a past surgical history that includes Abdominal hysterectomy and Breast biopsy (Left). Ms. Weedon has a current medication list which includes the following prescription(s): omeprazole, tramadol, valsartan, celecoxib, and methylprednisolone. Her primarily concern today is the Knee Pain (bilat),  Leg Pain (claves bilat/stiff), and Foot Pain (right side of right foot aches)  Initial Vital Signs:  Pulse/HCG Rate: 73ECG Heart Rate: (!) 16 Temp: (!) 97.1 F (36.2 C) Resp: 16 BP: (!) 145/67 SpO2: 100 %  BMI: Estimated body mass index is 24.13 kg/m as calculated from the following:   Height as of this encounter: 5\' 5"  (1.651 m).   Weight as of this encounter: 145 lb (65.8 kg).  Risk Assessment: Allergies: Reviewed. She is allergic to oxycodone-acetaminophen and sulfa antibiotics.  Allergy Precautions: None required Coagulopathies: Reviewed. None identified.  Blood-thinner therapy: None at this time Active Infection(s): Reviewed. None identified. Ms. Royle is afebrile  Site Confirmation: Ms. Wagman was asked to confirm the procedure and laterality before marking the site Procedure checklist: Completed Consent: Before the procedure and under the influence of no sedative(s), amnesic(s), or anxiolytics, the patient was informed of the treatment options, risks and possible complications. To fulfill our ethical and legal obligations, as recommended by the American Medical Association's Code of Ethics, I have informed the patient of my clinical impression; the nature and purpose of the treatment or procedure; the risks, benefits, and possible complications of the intervention; the alternatives, including doing nothing; the risk(s) and benefit(s) of the alternative treatment(s) or procedure(s); and the risk(s) and benefit(s) of doing nothing. The patient was provided information about the general risks and possible complications associated with the procedure. These may include, but are not limited to: failure to achieve desired goals, infection, bleeding, organ or nerve damage, allergic reactions, paralysis, and death. In addition, the patient was informed of those risks and complications associated to the procedure, such as failure to decrease pain; infection; bleeding; organ or nerve damage  with subsequent damage to sensory, motor, and/or autonomic systems, resulting in permanent pain, numbness,  and/or weakness of one or several areas of the body; allergic reactions; (i.e.: anaphylactic reaction); and/or death. Furthermore, the patient was informed of those risks and complications associated with the medications. These include, but are not limited to: allergic reactions (i.e.: anaphylactic or anaphylactoid reaction(s)); adrenal axis suppression; blood sugar elevation that in diabetics may result in ketoacidosis or comma; water retention that in patients with history of congestive heart failure may result in shortness of breath, pulmonary edema, and decompensation with resultant heart failure; weight gain; swelling or edema; medication-induced neural toxicity; particulate matter embolism and blood vessel occlusion with resultant organ, and/or nervous system infarction; and/or aseptic necrosis of one or more joints. Finally, the patient was informed that Medicine is not an exact science; therefore, there is also the possibility of unforeseen or unpredictable risks and/or possible complications that may result in a catastrophic outcome. The patient indicated having understood very clearly. We have given the patient no guarantees and we have made no promises. Enough time was given to the patient to ask questions, all of which were answered to the patient's satisfaction. Ms. Gwynne has indicated that she wanted to continue with the procedure. Attestation: I, the ordering provider, attest that I have discussed with the patient the benefits, risks, side-effects, alternatives, likelihood of achieving goals, and potential problems during recovery for the procedure that I have provided informed consent. Date  Time: 06/24/2020  9:43 AM  Pre-Procedure Preparation:  Monitoring: As per clinic protocol. Respiration, ETCO2, SpO2, BP, heart rate and rhythm monitor placed and checked for adequate  function Safety Precautions: Patient was assessed for positional comfort and pressure points before starting the procedure. Time-out: I initiated and conducted the "Time-out" before starting the procedure, as per protocol. The patient was asked to participate by confirming the accuracy of the "Time Out" information. Verification of the correct person, site, and procedure were performed and confirmed by me, the nursing staff, and the patient. "Time-out" conducted as per Joint Commission's Universal Protocol (UP.01.01.01). Time: 1015  Description of Procedure:          Target Area: Knee Joint Approach: Just above the Medial tibial plateau, lateral to the infrapatellar tendon. Area Prepped: Entire knee area, from the mid-thigh to the mid-shin. DuraPrep (Iodine Povacrylex [0.7% available iodine] and Isopropyl Alcohol, 74% w/w) Safety Precautions: Aspiration looking for blood return was conducted prior to all injections. At no point did we inject any substances, as a needle was being advanced. No attempts were made at seeking any paresthesias. Safe injection practices and needle disposal techniques used. Medications properly checked for expiration dates. SDV (single dose vial) medications used. Description of the Procedure: Protocol guidelines were followed. The patient was placed in position over the fluoroscopy table. The target area was identified and the area prepped in the usual manner. Skin & deeper tissues infiltrated with local anesthetic. Appropriate amount of time allowed to pass for local anesthetics to take effect. The procedure needles were then advanced to the target area. Proper needle placement secured. Negative aspiration confirmed. Solution injected in intermittent fashion, asking for systemic symptoms every 0.5cc of injectate. The needles were then removed and the area cleansed, making sure to leave some of the prepping solution back to take advantage of its long term bactericidal  properties. Vitals:   06/24/20 0953 06/24/20 1000  BP: (!) 145/67 (!) 148/70  Pulse: 73 70  Resp: 16   Temp: (!) 97.1 F (36.2 C) (!) 97.1 F (36.2 C)  TempSrc: Temporal Temporal  SpO2: 100% 99%  Weight: 145 lb (65.8 kg)   Height: 5\' 5"  (1.651 m)     Start Time: 1016 hrs. End Time: 1017 hrs. Materials:  Needle(s) Type: Regular needle Gauge: 25G Length: 1.5-in Medication(s): Please see orders for medications and dosing details.  Imaging Guidance:          Type of Imaging Technique: None used Indication(s): N/A Exposure Time: No patient exposure Contrast: None used. Fluoroscopic Guidance: N/A Ultrasound Guidance: N/A Interpretation: N/A  Antibiotic Prophylaxis:   Anti-infectives (From admission, onward)   None     Indication(s): None identified  Post-operative Assessment:  Post-procedure Vital Signs:  Pulse/HCG Rate: 70(!) 16 Temp: (!) 97.1 F (36.2 C) Resp: 16 BP: (!) 148/70 SpO2: 99 %  EBL: None  Complications: No immediate post-treatment complications observed by team, or reported by patient.  Note: The patient tolerated the entire procedure well. A repeat set of vitals were taken after the procedure and the patient was kept under observation following institutional policy, for this type of procedure. Post-procedural neurological assessment was performed, showing return to baseline, prior to discharge. The patient was provided with post-procedure discharge instructions, including a section on how to identify potential problems. Should any problems arise concerning this procedure, the patient was given instructions to immediately contact us, at any time, without hesitation. In any case, we plan to contact the patient by telephone for a follow-up status report regarding this interventional procedure.  Comments:  No additional relevant information.  Plan of Care  Orders:  Orders Placed This Encounter  Procedures  . KNEE INJECTION    Hyalgan knee injection.  Please order Hyalgan.    Standing Status:   Future    Standing Expiration Date:   07/25/2020    Scheduling Instructions:     Procedure: Intra-articular Hyalgan Knee injection    #3        Side: Bilateral     Sedation: None     Timeframe: in 4 weeks    Order Specific Question:   Where will this procedure be performed?    Answer:   [facility]   Medications ordered for procedure: Meds ordered this encounter  Medications  . Sodium Hyaluronate SOSY 2 mL  . Sodium Hyaluronate SOSY 2 mL  . lidocaine (XYLOCAINE) 2 % (with pres) injection 400 mg  . celecoxib (CELEBREX) 100 MG capsule    Sig: Take 1 capsule (100 mg total) by mouth daily as needed.    Dispense:  30 capsule    Refill:  1  . methylPREDNISolone (MEDROL) 4 MG TBPK tablet    Sig: Follow package instructions.    Dispense:  21 tablet    Refill:  0    Do not add to the "Automatic Refill" notification system.   Medications administered: We administered Sodium Hyaluronate, Sodium Hyaluronate, and lidocaine.  See the medical record for exact dosing, route, and time of administration.  Follow-up plan:   Return in about 4 weeks (around 07/22/2020) for Hyalgan #3.       Interventional management options: Planned, scheduled, and/or pending:    Bilateral knee Hyalgan injection 06/01/20 #1, #2 06/24/2020 Bilateral L2-L5 lumbar radiofrequency ablation   Considering:   Sprint peripheral nerve stimulation   PRN Procedures:   None at this time     Recent Visits Date Type Provider Dept  06/01/20 Procedure visit Gillis Santa, MD [facility]  05/05/20 Office Visit Gillis Santa, MD [facility]  04/21/20 Office Visit Gillis Santa, MD [facility]  Showing recent visits within past 90 days and meeting all other requirements Today's Visits Date Type Provider Dept  06/24/20 Procedure visit Gillis Santa, MD [facility]  Showing today's visits and meeting all other requirements Future  Appointments Date Type Provider Dept  07/22/20 Appointment Gillis Santa, MD [facility]  08/04/20 Appointment Gillis Santa, MD [facility]  Showing future appointments within next 90 days and meeting all other requirements  Disposition: Discharge home  Discharge (Date  Time): 06/24/2020; 1031 hrs.   Primary Care Physician: Alfonse Flavors, MD Location: [facility] Note by: Gillis Santa, MD Date: 06/24/2020; Time: 1:52 PM  Disclaimer:  Medicine is not an exact science. The only guarantee in medicine is that nothing is guaranteed. It is important to note that the decision to proceed with this intervention was based on the information collected from the patient. The Data and conclusions were drawn from the patient's questionnaire, the interview, and the physical examination. Because the information was provided in large part by the patient, it cannot be guaranteed that it has not been purposely or unconsciously manipulated. Every effort has been made to obtain as much relevant data as possible for this evaluation. It is important to note that the conclusions that lead to this procedure are derived in large part from the available data. Always take into account that the treatment will also be dependent on availability of resources and existing treatment guidelines, considered by other Pain Management Practitioners as being common knowledge and practice, at the time of the intervention. For Medico-Legal purposes, it is also important to point out that variation in procedural techniques and pharmacological choices are the acceptable norm. The indications, contraindications, technique, and results of the above procedure should only be interpreted and judged by a Board-Certified Interventional Pain Specialist with extensive familiarity and expertise in the same exact procedure and technique.

## 2020-06-24 NOTE — Patient Instructions (Signed)
Magnesium [phone] mg daily. Dr. Holley Raring to call in Medrol dose pack and Celebrex.

## 2020-06-25 ENCOUNTER — Telehealth: Payer: Self-pay

## 2020-06-25 NOTE — Telephone Encounter (Signed)
Post procedure phone call.  Patient states she has a little bit of an upset stomach but has recently started taking celebrex and wonders if this could be the cause.  Informed patient to make sure she eats before taking celebrex.  Informed her to call us for any further questions or concerns.

## 2020-07-09 ENCOUNTER — Telehealth: Payer: Self-pay | Admitting: Student in an Organized Health Care Education/Training Program

## 2020-07-09 NOTE — Telephone Encounter (Signed)
Patient states she has some questions about her billing and what was charged for her last 2 procedures. Insurance is telling her it is because of how it was billed that she is getting charged so much can you check this and let patient know.

## 2020-07-09 NOTE — Telephone Encounter (Signed)
Has she spoke to billing?

## 2020-07-13 NOTE — Telephone Encounter (Signed)
I did give her patient accounts number and explained we did not have access to billing here.

## 2020-07-22 ENCOUNTER — Ambulatory Visit: Payer: Medicare HMO | Admitting: Student in an Organized Health Care Education/Training Program

## 2020-08-04 ENCOUNTER — Encounter: Payer: Medicare HMO | Admitting: Student in an Organized Health Care Education/Training Program

## 2020-12-08 ENCOUNTER — Ambulatory Visit: Payer: Medicare HMO | Admitting: Dermatology

## 2021-01-19 ENCOUNTER — Ambulatory Visit: Payer: Medicare HMO | Admitting: Dermatology

## 2021-04-09 ENCOUNTER — Other Ambulatory Visit: Payer: Self-pay | Admitting: Family Medicine

## 2021-04-15 ENCOUNTER — Other Ambulatory Visit: Payer: Self-pay | Admitting: Family Medicine

## 2021-04-15 DIAGNOSIS — Z1231 Encounter for screening mammogram for malignant neoplasm of breast: Secondary | ICD-10-CM

## 2021-04-15 DIAGNOSIS — M810 Age-related osteoporosis without current pathological fracture: Secondary | ICD-10-CM

## 2021-05-04 ENCOUNTER — Other Ambulatory Visit: Payer: Self-pay | Admitting: General Surgery

## 2021-05-04 ENCOUNTER — Ambulatory Visit
Admission: RE | Admit: 2021-05-04 | Discharge: 2021-05-04 | Disposition: A | Discharge status: Home / Self Care | Payer: Medicare HMO | Source: Ambulatory Visit | Attending: General Surgery | Admitting: General Surgery

## 2021-05-04 ENCOUNTER — Other Ambulatory Visit: Payer: Self-pay

## 2021-05-04 DIAGNOSIS — K439 Ventral hernia without obstruction or gangrene: Secondary | ICD-10-CM | POA: Insufficient documentation

## 2021-05-25 ENCOUNTER — Other Ambulatory Visit: Payer: Medicare HMO

## 2021-05-28 ENCOUNTER — Other Ambulatory Visit: Payer: Medicare HMO

## 2021-05-31 ENCOUNTER — Ambulatory Visit: Payer: Medicare HMO

## 2021-05-31 ENCOUNTER — Inpatient Hospital Stay: Admission: RE | Admit: 2021-05-31 | Payer: Medicare HMO | Source: Ambulatory Visit

## 2021-06-04 NOTE — Addendum Note (Signed)
Encounter addended by: Annie Paras on: 06/04/2021 1:55 PM  Actions taken: Letter saved

## 2021-06-08 ENCOUNTER — Ambulatory Visit: Payer: Medicare HMO | Admitting: Dermatology

## 2021-06-09 ENCOUNTER — Ambulatory Visit: Admit: 2021-06-09 | Payer: Medicare HMO | Admitting: General Surgery

## 2021-06-09 SURGERY — REPAIR, HERNIA, VENTRAL, ROBOT-ASSISTED
Anesthesia: General | Site: Abdomen

## 2021-06-11 ENCOUNTER — Telehealth: Payer: Self-pay | Admitting: *Deleted

## 2021-06-11 NOTE — Telephone Encounter (Signed)
Patient is calling with questions: she got incidental finding letter. Patient advised: Follow up with PCP to review finding and recommendations. Patient is in between providers and is going to contact her surgeon for follow up. Advised : Open scheduling option

## 2021-06-21 ENCOUNTER — Other Ambulatory Visit: Payer: Self-pay

## 2021-06-21 ENCOUNTER — Ambulatory Visit
Admission: RE | Admit: 2021-06-21 | Discharge: 2021-06-21 | Disposition: A | Discharge status: Home / Self Care | Payer: Medicare HMO | Source: Ambulatory Visit | Attending: Family Medicine | Admitting: Family Medicine

## 2021-06-21 DIAGNOSIS — Z1231 Encounter for screening mammogram for malignant neoplasm of breast: Secondary | ICD-10-CM | POA: Diagnosis present

## 2021-06-21 DIAGNOSIS — M810 Age-related osteoporosis without current pathological fracture: Secondary | ICD-10-CM | POA: Insufficient documentation

## 2021-06-24 ENCOUNTER — Other Ambulatory Visit: Payer: Medicare HMO

## 2021-06-24 ENCOUNTER — Ambulatory Visit: Payer: Medicare HMO

## 2021-06-24 ENCOUNTER — Other Ambulatory Visit: Payer: Self-pay | Admitting: Family Medicine

## 2021-06-24 DIAGNOSIS — R160 Hepatomegaly, not elsewhere classified: Secondary | ICD-10-CM

## 2021-06-25 ENCOUNTER — Ambulatory Visit
Admission: RE | Admit: 2021-06-25 | Discharge: 2021-06-25 | Disposition: A | Discharge status: Home / Self Care | Payer: Medicare HMO | Source: Ambulatory Visit | Attending: Family Medicine | Admitting: Family Medicine

## 2021-06-25 ENCOUNTER — Other Ambulatory Visit: Payer: Self-pay

## 2021-06-25 DIAGNOSIS — R160 Hepatomegaly, not elsewhere classified: Secondary | ICD-10-CM | POA: Insufficient documentation

## 2021-06-25 MED ORDER — GADOBUTROL 1 MMOL/ML IV SOLN
6.0000 mL | Freq: Once | INTRAVENOUS | Status: AC | PRN
  Administered 2021-06-25: 6 mL via INTRAVENOUS

## 2021-07-14 ENCOUNTER — Other Ambulatory Visit: Payer: Self-pay | Admitting: General Surgery

## 2021-07-14 NOTE — Progress Notes (Signed)
Subjective:     Patient ID: Gabriela Cohen is a 75 y.o. female.   HPI   The following portions of the patient's history were reviewed and updated as appropriate.   This an established patient is here today for: office visit. here for evaluation of a ventral hernia. She admits to increased pain, left abdomen. Patient reports that she had constipation two weeks ago, but yesterday she had nausea and diarrhea. Patient took imodium for nausea and felt better. Patient reports that the left hernia feels bigger and feels like it moves.    The patient has had several episodes of nausea, usually "dry heaves" in the last 2-4 weeks and one episode of a small emesis.  She describes no abdominal pain prior to the onset of a watering sensation in her mouth followed by vomiting.   She states the right abdomen has a "grape" size hernia as well according to the scan.  The patient is not aware of any difficulties on the right side of her abdomen.   She reports having a left inguinal hernia repair in the distant past by Rochel Brome, MD.  She first became aware of a "bulge" in the left side of the abdomen after a laparoscopic hysterectomy completed by Crista Curb, MD in the distant past.  This was subsequently repaired, but whether this was an open procedure or a laparoscopic procedure is lost to time.  No records from these surgical episodes are available.   The patient report intermittent spells where she will have nausea, occasionally with dry heaves, occasionally with a small amount of emesis.  This is not necessarily related to dietary intake or any discomfort at the site of the left lateral abdominal wall defect.  The latter is usually flat in the morning when she gets up and becomes more pronounced as the day goes on.  She has had intermittent spells of diarrhea, and episodes of constipation as well.  She frequently finds it necessary to use Lomotil to control diarrhea and then she becomes constipated and needs  to make use of a laxative.  I cannot find record of a previous colonoscopy.  There is record of a request for Cologuard testing, but no results.     Review of Systems  Constitutional: Negative for chills and fever.  Respiratory: Negative for cough.          Chief Complaint  Patient presents with   Hernia      BP 130/56   Pulse 78   Temp 36.7 C (98 F)   Ht 165.1 cm (_0 )   Wt 63 kg (139 lb)   LMP 10/10/1996 (Exact Date)   SpO2 96%   BMI 23.13 kg/m    The patient has experienced about a 2 kg weight loss since her initial visit in August.       Past Medical History:  Diagnosis Date   Anxiety     Arthritis     Hypertension             Past Surgical History:  Procedure Laterality Date   BREAST SURGERY Left     COLONOSCOPY   08/05/2008    Hyperplastic Polyp: CBF 08/2018 Recall ltr mailed 07/04/18   HYSTERECTOMY       Laparoscopic/robotically-assisted abdominal sacral colpopexy, enterocele repair, midurethral sling, urethropexy, and cystoscopy   01/30/2009   OOPHORECTOMY                    OB History  Gravida  3   Para  2   Term      Preterm      AB      Living         SAB      IAB      Ectopic      Molar      Multiple      Live Births           Obstetric Comments  Age at first period Age of first pregnancy             Social History           Socioeconomic History   Marital status: Married  Tobacco Use   Smoking status: Never Smoker   Smokeless tobacco: Never Used  Scientific laboratory technician Use: Never used  Substance and Sexual Activity   Alcohol use: No   Drug use: No   Sexual activity: Yes      Partners: Male             Allergies  Allergen Reactions   Percocet [Oxycodone-Acetaminophen] Other (See Comments)      Other Reaction: GI Upset   Sulfa (Sulfonamide Antibiotics) Itching   Celecoxib Diarrhea   Sulfamethoxazole-Trimethoprim Itching      Current Medications        Current Outpatient Medications   Medication Sig Dispense Refill   amLODIPine (NORVASC) 5 MG tablet Take 1 tablet by mouth once daily       calcium citrate-vitamin D3 (CITRACAL+D) 315 mg-5 mcg (200 unit) tablet Take 1 tablet by mouth once daily       cholecalciferol (VITAMIN D3) 1000 unit tablet Take by mouth once daily       meloxicam (MOBIC) 15 MG tablet Take 15 mg by mouth once daily.       omeprazole (PRILOSEC) 20 MG DR capsule Take 1 capsule (20 mg total) by mouth 2 (two) times daily Take 30 minutes before breakfast. Take an extra dose 30 minutes before supper if needed. 180 capsule 3   predniSONE (DELTASONE) 10 mg tablet pack as directed       traMADoL (ULTRAM) 50 mg tablet as needed       valsartan (DIOVAN) 160 MG tablet Take 1 tablet by mouth once daily       amLODIPine-valsartan-hcthiazid 5-160-25 mg Tab Take 1 tablet by mouth once daily (Patient not taking: Reported on 05/04/2021)        No current facility-administered medications for this visit.             Family History  Problem Relation Age of Onset   Breast cancer Mother 53   Heart disease Father     Stroke Brother     Breast cancer Maternal Aunt 35        Labs and Radiology:    CT scan of the abdomen and pelvis dated May 04, 2021:   This exam was independently reviewed.  There is a left lower quadrant hernia defect measuring approximately 2.5 cm with a nonobstructed loop of descending colon within it.  There is a fascial defect in the right lateral abdominal wall just at the level of the umbilicus measuring 17 mm in defect with adipose tissue within it.  This is adjacent to the descending colon.  Nonobstructing calculus in the lower pole of the left kidney.  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  This was a  noncontrast enhanced study.   MRI of the abdomen:   This examination was completed to assess a hypodense area in superior aspect of the left lobe of the liver.  This was shown to be  a simple cyst.  No biliary tree abnormalities.  The duodenal diverticulum appears separate from the ampulla.  No evidence of gallstones.  The lower abdominal fascial defects are not visualized on this exam.     Laboratory data of May 04, 2021:              Ref Range & Units   2 mo ago WBC (White Blood Cell Count)     4.1 - 10.2 10^3/uL    5.0  RBC (Red Blood Cell Count)         4.04 - 5.48 10^6/uL  4.56  Hemoglobin   12.0 - 15.0 gm/dL     14.3  Hematocrit     35.0 - 47.0 % 41.7  MCV (Mean Corpuscular Volume)           80.0 - 100.0 fl           91.4  MCH (Mean Corpuscular Hemoglobin)    27.0 - 31.2 pg           31.4 High   MCHC (Mean Corpuscular Hemoglobin Concentration)          32.0 - 36.0 gm/dL     34.3  Platelet Count          150 - 450 10^3/uL    196  RDW-CV (Red Cell Distribution Width)    11.6 - 14.8 % 12.5  MPV (Mean Platelet Volume)        9.4 - 12.4 fl    9.2 Low   Neutrophils    1.50 - 7.80 10^3/uL  3.19  Lymphocytes 1.00 - 3.60 10^3/uL  1.38  Monocytes     0.00 - 1.50 10^3/uL  0.26  Eosinophils    0.00 - 0.55 10^3/uL  0.15  Basophils      0.00 - 0.09 10^3/uL  0.01  Neutrophil % 32.0 - 70.0 % 63.9  Lymphocyte %         10.0 - 50.0 % 27.7      Monocyte %  4.0 - 13.0 %   5.2  Eosinophil % 1.0 - 5.0 %     3.0  Basophil%     0.0 - 2.0 %     0.2  Immature Granulocyte %    <=0.7 %         0.0  Immature Granulocyte Count        <=0.06 10^3/L        0.00    Glucose         70 - 110 mg/dL         114 High   Sodium          136 - 145 mmol/L     139  Potassium     3.6 - 5.1 mmol/L       4.4  Chloride         97 - 109 mmol/L       104  Carbon Dioxide (CO2)        22.0 - 32.0 mmol/L   32.0  Urea Nitrogen (BUN)          7 - 25 mg/dL  18  Creatinine      0.6 - 1.1 mg/dL  0.8  Glomerular Filtration Rate (eGFR), MDRD Estimate     >60 mL/min/1.73sq m         70  Calcium         8.7 - 10.3 mg/dL       8.9  AST    8 - 39 U/L      15  ALT     5 - 38 U/L      10  Alk Phos  (alkaline Phosphatase)   34 - 104 U/L  78  Albumin         3.5 - 4.8 g/dL 4.1  Bilirubin, Total          0.3 - 1.2 mg/dL         1.2  Protein, Total 6.1 - 7.9 g/dL 6.2  A/G Ratio      1.0 - 5.0 gm/dL         2.0  Specimen Collected: 05/04/21 10:55       Last Resulted: 05/04/21 12:20 Received From: [facility]           Result Received: 05/04/21 12:44          Objective:   Physical Exam Exam conducted with a chaperone present.  Constitutional:      Appearance: Normal appearance.  Cardiovascular:     Rate and Rhythm: Normal rate and regular rhythm.     Pulses: Normal pulses.     Heart sounds: Normal heart sounds.  Pulmonary:     Effort: Pulmonary effort is normal.     Breath sounds: Normal breath sounds.  Abdominal:     General: Abdomen is flat. Bowel sounds are normal.     Palpations: Abdomen is soft.     Hernia: A hernia is present. Ventral: left lateral abdominal wall.    Musculoskeletal:     Cervical back: Neck supple.  Skin:    General: Skin is warm and dry.  Neurological:     Mental Status: She is alert and oriented to person, place, and time.  Psychiatric:        Mood and Affect: Mood normal.        Behavior: Behavior normal.      The patient had been seen earlier this fall by Jessica Priest, MD from the surgery department.  Surgical repair was offered as an opportunity to relieve her present bulge.  The patient, on recommendation from friends, son is seeking a second opinion today.   Assessment:     Symptomatic fascial defect on the left side of the abdomen likely related to prior laparoscopic port site.   Asymptomatic small fascial defect on the right side of the abdomen on CT without clinical correlate.   Episodic nausea not clearly related to meals or hernia size/discomfort.   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    Plan:     The patient's options for management of the symptomatic left lateral abdominal wall hernia are 1) use of a compressive garment such as a truss to minimize protrusion versus 2) surgical repair.  With the identification of the right side defect, laparoscopic repair could be ideal as it would allow visualization of both sites and potentially mesh reinforcement of both sites.   At the time of her visit I had not had a chance to completely review her CT, laboratory and MRI images, but at this time  I see no additional pathology that would account for her abdominal discomfort and variable bowel habits.  A colonoscopy or Cologuard testing would be ideal but apparently this has been pending now for almost a year.  Colonoscopy would be best completed after hernia repair to minimize problems during endoscopic passage of the descending colon.   The patient has been asked to make use of MiraLAX, 1 capful daily to minimize episodes of constipation and was provided with a prescription for Zofran 4 mg tablets to use if nausea occurs.   At this time, she is leaning towards repair and she will be contacted by the staff on July 12, 2021 to confirm her desires.          This note is partially prepared by Karie Fetch, RN, acting as a scribe in the presence of Dr. Hervey Ard, MD.  The documentation recorded by the scribe accurately reflects the service I personally performed and the decisions made by me.    Robert Bellow, MD FACS

## 2021-07-23 ENCOUNTER — Other Ambulatory Visit
Admission: RE | Admit: 2021-07-23 | Discharge: 2021-07-23 | Disposition: A | Discharge status: Home / Self Care | Payer: Medicare HMO | Source: Ambulatory Visit | Attending: General Surgery | Admitting: General Surgery

## 2021-07-23 ENCOUNTER — Other Ambulatory Visit: Payer: Self-pay

## 2021-07-23 DIAGNOSIS — I10 Essential (primary) hypertension: Secondary | ICD-10-CM

## 2021-07-23 HISTORY — DX: Unspecified osteoarthritis, unspecified site: M19.90

## 2021-07-23 NOTE — Patient Instructions (Addendum)
Your procedure is scheduled on: 08/04/21 - Wednesday Report to the Registration Desk on the 1st floor of the [facility]. To find out your arrival time, please call [phone] between 1PM - 3PM on: 08/03/21 - Tuesday Report to [facility] for Labs/EKG on 07/27/21 at 12:00 pm.  REMEMBER: Instructions that are not followed completely may result in serious medical risk, up to and including death; or upon the discretion of your surgeon and anesthesiologist your surgery may need to be rescheduled.  Do not eat food after midnight the night before surgery.  No gum chewing, lozengers or hard candies.  You may however, drink CLEAR liquids up to 2 hours before you are scheduled to arrive for your surgery. Do not drink anything within 2 hours of your scheduled arrival time.  Clear liquids include: - water  - apple juice without pulp - gatorade (not RED, PURPLE, OR BLUE) - black coffee or tea (Do NOT add milk or creamers to the coffee or tea) Do NOT drink anything that is not on this list.  TAKE THESE MEDICATIONS THE MORNING OF SURGERY WITH A SIP OF WATER:  - amLODipine (NORVASC) 5 MG tablet - omeprazole (PRILOSEC) 20 MG capsule, (take one the night before and one on the morning of surgery - helps to prevent nausea after surgery.) - traMADol (ULTRAM) 50 MG tablet  One week prior to surgery stop taking meloxicam (MOBIC) 15 MG tablet. Stop Anti-inflammatories (NSAIDS) such as Advil, Aleve, Ibuprofen, Motrin, Naproxen, Naprosyn and Aspirin based products such as Excedrin, Goodys Powder, BC Powder.  Stop ANY OVER THE COUNTER supplements until after surgery beginning 1 week before surgery. MultiVitamin and Vitamin D3  You may however, continue to take Tylenol if needed for pain up until the day of surgery.  No Alcohol for 24 hours before or after surgery.  No Smoking including e-cigarettes for 24 hours prior to surgery.  No chewable tobacco products for at least 6 hours prior to  surgery.  No nicotine patches on the day of surgery.  Do not use any "recreational" drugs for at least a week prior to your surgery.  Please be advised that the combination of cocaine and anesthesia may have negative outcomes, up to and including death. If you test positive for cocaine, your surgery will be cancelled.  On the morning of surgery brush your teeth with toothpaste and water, you may rinse your mouth with mouthwash if you wish. Do not swallow any toothpaste or mouthwash.  Use CHG Soap or wipes as directed on instruction sheet.  Do not wear jewelry, make-up, hairpins, clips or nail polish.  Do not wear lotions, powders, or perfumes.   Do not shave body from the neck down 48 hours prior to surgery just in case you cut yourself which could leave a site for infection.  Also, freshly shaved skin may become irritated if using the CHG soap.  Contact lenses, hearing aids and dentures may not be worn into surgery.  Do not bring valuables to the hospital. [facility] is not responsible for any missing/lost belongings or valuables.   Notify your doctor if there is any change in your medical condition (cold, fever, infection).  Wear comfortable clothing (specific to your surgery type) to the hospital.  After surgery, you can help prevent lung complications by doing breathing exercises.  Take deep breaths and cough every 1-2 hours. Your doctor may order a device called an Incentive Spirometer to help you take deep breaths. When coughing or sneezing,  hold a pillow firmly against your incision with both hands. This is called "splinting." Doing this helps protect your incision. It also decreases belly discomfort.  If you are being admitted to the hospital overnight, leave your suitcase in the car. After surgery it may be brought to your room.  If you are being discharged the day of surgery, you will not be allowed to drive home. You will need a responsible adult (18 years or older) to  drive you home and stay with you that night.   If you are taking public transportation, you will need to have a responsible adult (18 years or older) with you. Please confirm with your physician that it is acceptable to use public transportation.   Please call the [facility] Dept. at [phone] if you have any questions about these instructions.  Surgery Visitation Policy:  Patients undergoing a surgery or procedure may have one family member or support person with them as long as that person is not COVID-19 positive or experiencing its symptoms.  That person may remain in the waiting area during the procedure and may rotate out with other people.  Inpatient Visitation:    Visiting hours are 7 a.m. to 8 p.m. Up to two visitors ages 16+ are allowed at one time in a patient room. The visitors may rotate out with other people during the day. Visitors must check out when they leave, or other visitors will not be allowed. One designated support person may remain overnight. The visitor must pass COVID-19 screenings, use hand sanitizer when entering and exiting the patient's room and wear a mask at all times, including in the patient's room. Patients must also wear a mask when staff or their visitor are in the room. Masking is required regardless of vaccination status.

## 2021-07-27 ENCOUNTER — Other Ambulatory Visit: Payer: Self-pay

## 2021-07-27 ENCOUNTER — Other Ambulatory Visit
Admission: RE | Admit: 2021-07-27 | Discharge: 2021-07-27 | Disposition: A | Discharge status: Home / Self Care | Payer: Medicare HMO | Source: Ambulatory Visit | Attending: General Surgery | Admitting: General Surgery

## 2021-07-27 DIAGNOSIS — I10 Essential (primary) hypertension: Secondary | ICD-10-CM | POA: Insufficient documentation

## 2021-07-27 LAB — BASIC METABOLIC PANEL
Anion gap: 7 (ref 5–15)
BUN: 14 mg/dL (ref 8–23)
CO2: 28 mmol/L (ref 22–32)
Calcium: 9 mg/dL (ref 8.9–10.3)
Chloride: 105 mmol/L (ref 98–111)
Creatinine, Ser: 0.73 mg/dL (ref 0.44–1.00)
GFR, Estimated: >60 mL/min (ref >60)
Glucose, Bld: 111 mg/dL — ABNORMAL HIGH (ref 70–99)
Potassium: 3.2 mmol/L — ABNORMAL LOW (ref 3.5–5.1)
Sodium: 140 mmol/L (ref 135–145)

## 2021-07-27 LAB — CBC
HCT: 44.5 % (ref 36.0–46.0)
Hemoglobin: 15 g/dL (ref 12.0–15.0)
MCH: 30.6 pg (ref 26.0–34.0)
MCHC: 33.7 g/dL (ref 30.0–36.0)
MCV: 90.8 fL (ref 80.0–100.0)
Platelets: 254 10*3/uL (ref 150–400)
RBC: 4.9 MIL/uL (ref 3.87–5.11)
RDW: 12.5 % (ref 11.5–15.5)
WBC: 5.9 10*3/uL (ref 4.0–10.5)
nRBC: 0 % (ref 0.0–0.2)

## 2021-08-04 ENCOUNTER — Observation Stay
Admission: RE | Admit: 2021-08-04 | Discharge: 2021-08-05 | Disposition: A | Discharge status: Home / Self Care | Payer: Medicare HMO | Attending: General Surgery | Admitting: General Surgery

## 2021-08-04 ENCOUNTER — Encounter: Payer: Self-pay | Admitting: General Surgery

## 2021-08-04 ENCOUNTER — Other Ambulatory Visit: Payer: Self-pay

## 2021-08-04 ENCOUNTER — Ambulatory Visit: Payer: Medicare HMO | Admitting: Anesthesiology

## 2021-08-04 ENCOUNTER — Encounter
Admission: RE | Disposition: A | Discharge status: Home / Self Care | Payer: Self-pay | Source: Home / Self Care | Attending: General Surgery

## 2021-08-04 DIAGNOSIS — K439 Ventral hernia without obstruction or gangrene: Secondary | ICD-10-CM | POA: Diagnosis not present

## 2021-08-04 DIAGNOSIS — R2689 Other abnormalities of gait and mobility: Secondary | ICD-10-CM | POA: Insufficient documentation

## 2021-08-04 DIAGNOSIS — Z79899 Other long term (current) drug therapy: Secondary | ICD-10-CM | POA: Insufficient documentation

## 2021-08-04 DIAGNOSIS — I10 Essential (primary) hypertension: Secondary | ICD-10-CM | POA: Insufficient documentation

## 2021-08-04 HISTORY — PX: VENTRAL HERNIA REPAIR: SHX424

## 2021-08-04 HISTORY — DX: Gastro-esophageal reflux disease without esophagitis: K21.9

## 2021-08-04 HISTORY — DX: Anxiety disorder, unspecified: F41.9

## 2021-08-04 HISTORY — DX: Age-related osteoporosis without current pathological fracture: M81.0

## 2021-08-04 SURGERY — REPAIR, HERNIA, VENTRAL, LAPAROSCOPIC
Anesthesia: General | Site: Abdomen

## 2021-08-04 MED ORDER — KETOROLAC TROMETHAMINE 15 MG/ML IJ SOLN
INTRAMUSCULAR | Status: AC
  Filled 2021-08-04: qty 1

## 2021-08-04 MED ORDER — ROCURONIUM BROMIDE 100 MG/10ML IV SOLN
INTRAVENOUS | Status: DC | PRN
  Administered 2021-08-04: 50 mg via INTRAVENOUS
  Administered 2021-08-04 (×2): 10 mg via INTRAVENOUS

## 2021-08-04 MED ORDER — CHLORHEXIDINE GLUCONATE 0.12 % MT SOLN: 15.0000 mL | Freq: Once | OROMUCOSAL | Status: AC

## 2021-08-04 MED ORDER — FENTANYL CITRATE (PF) 100 MCG/2ML IJ SOLN
INTRAMUSCULAR | Status: AC
  Filled 2021-08-04: qty 2

## 2021-08-04 MED ORDER — SEVOFLURANE IN SOLN
RESPIRATORY_TRACT | Status: AC
  Filled 2021-08-04: qty 250

## 2021-08-04 MED ORDER — HYDROMORPHONE HCL 1 MG/ML IJ SOLN
0.2500 mg | INTRAMUSCULAR | Status: DC | PRN
  Administered 2021-08-04: 0.25 mg via INTRAVENOUS
  Administered 2021-08-04 (×2): 0.5 mg via INTRAVENOUS
  Administered 2021-08-04: 0.25 mg via INTRAVENOUS
  Administered 2021-08-04: 0.5 mg via INTRAVENOUS

## 2021-08-04 MED ORDER — ACETAMINOPHEN 10 MG/ML IV SOLN
1000.0000 mg | Freq: Four times a day (QID) | INTRAVENOUS | Status: DC
  Administered 2021-08-04 – 2021-08-05 (×2): 1000 mg via INTRAVENOUS
  Filled 2021-08-04 (×5): qty 100

## 2021-08-04 MED ORDER — HYDROMORPHONE HCL 1 MG/ML IJ SOLN
0.5000 mg | INTRAMUSCULAR | Status: DC | PRN
  Administered 2021-08-04: 0.5 mg via INTRAVENOUS

## 2021-08-04 MED ORDER — ONDANSETRON HCL 4 MG/2ML IJ SOLN: 4.0000 mg | INTRAMUSCULAR | Status: DC | PRN

## 2021-08-04 MED ORDER — ACETAMINOPHEN 325 MG PO TABS: 325.0000 mg | ORAL_TABLET | ORAL | Status: DC | PRN

## 2021-08-04 MED ORDER — ACETAMINOPHEN 10 MG/ML IV SOLN: 1000.0000 mg | Freq: Once | INTRAVENOUS | Status: DC | PRN

## 2021-08-04 MED ORDER — DEXMEDETOMIDINE (PRECEDEX) IN NS 20 MCG/5ML (4 MCG/ML) IV SYRINGE
PREFILLED_SYRINGE | INTRAVENOUS | Status: DC | PRN
  Administered 2021-08-04: 4 ug via INTRAVENOUS
  Administered 2021-08-04 (×2): 8 ug via INTRAVENOUS

## 2021-08-04 MED ORDER — PROPOFOL 10 MG/ML IV BOLUS
INTRAVENOUS | Status: DC | PRN
  Administered 2021-08-04: 150 mg via INTRAVENOUS

## 2021-08-04 MED ORDER — LACTATED RINGERS IV SOLN: INTRAVENOUS | Status: DC

## 2021-08-04 MED ORDER — PHENYLEPHRINE HCL (PRESSORS) 10 MG/ML IV SOLN
INTRAVENOUS | Status: DC | PRN
  Administered 2021-08-04: 100 ug via INTRAVENOUS

## 2021-08-04 MED ORDER — ORAL CARE MOUTH RINSE: 15.0000 mL | Freq: Once | OROMUCOSAL | Status: AC

## 2021-08-04 MED ORDER — ONDANSETRON HCL 4 MG/2ML IJ SOLN
4.0000 mg | Freq: Once | INTRAMUSCULAR | Status: AC
  Administered 2021-08-04: 4 mg via INTRAVENOUS

## 2021-08-04 MED ORDER — CEFAZOLIN SODIUM-DEXTROSE 2-4 GM/100ML-% IV SOLN
2.0000 g | INTRAVENOUS | Status: AC
  Administered 2021-08-04: 2 g via INTRAVENOUS

## 2021-08-04 MED ORDER — SUGAMMADEX SODIUM 200 MG/2ML IV SOLN
INTRAVENOUS | Status: DC | PRN
  Administered 2021-08-04: 200 mg via INTRAVENOUS

## 2021-08-04 MED ORDER — ONDANSETRON HCL 4 MG/2ML IJ SOLN
INTRAMUSCULAR | Status: DC | PRN
  Administered 2021-08-04: 4 mg via INTRAVENOUS

## 2021-08-04 MED ORDER — BUPIVACAINE-EPINEPHRINE 0.5% -1:200000 IJ SOLN
INTRAMUSCULAR | Status: DC | PRN
  Administered 2021-08-04: 30 mL

## 2021-08-04 MED ORDER — PROPOFOL 10 MG/ML IV BOLUS
INTRAVENOUS | Status: AC
  Filled 2021-08-04: qty 20

## 2021-08-04 MED ORDER — ENOXAPARIN SODIUM 40 MG/0.4ML IJ SOSY
40.0000 mg | PREFILLED_SYRINGE | INTRAMUSCULAR | Status: DC
  Administered 2021-08-05: 40 mg via SUBCUTANEOUS
  Filled 2021-08-04: qty 0.4

## 2021-08-04 MED ORDER — CHLORHEXIDINE GLUCONATE 0.12 % MT SOLN
OROMUCOSAL | Status: AC
  Administered 2021-08-04: 15 mL via OROMUCOSAL
  Filled 2021-08-04: qty 15

## 2021-08-04 MED ORDER — SODIUM CHLORIDE 0.9 % IV SOLN
6.2500 mg | INTRAVENOUS | Status: DC | PRN
  Filled 2021-08-04: qty 0.25

## 2021-08-04 MED ORDER — TRAMADOL HCL 50 MG PO TABS: 50.0000 mg | ORAL_TABLET | Freq: Four times a day (QID) | ORAL | Status: DC | PRN

## 2021-08-04 MED ORDER — HYDROMORPHONE HCL 1 MG/ML IJ SOLN
INTRAMUSCULAR | Status: AC
  Filled 2021-08-04: qty 1

## 2021-08-04 MED ORDER — ACETAMINOPHEN 10 MG/ML IV SOLN
INTRAVENOUS | Status: DC | PRN
  Administered 2021-08-04: 1000 mg via INTRAVENOUS

## 2021-08-04 MED ORDER — MEPERIDINE HCL 25 MG/ML IJ SOLN
6.2500 mg | INTRAMUSCULAR | Status: DC | PRN
Start: 2021-08-04 — End: 2021-08-04

## 2021-08-04 MED ORDER — CHLORHEXIDINE GLUCONATE CLOTH 2 % EX PADS: 6.0000 | MEDICATED_PAD | Freq: Once | CUTANEOUS | Status: DC

## 2021-08-04 MED ORDER — BUPIVACAINE-EPINEPHRINE (PF) 0.5% -1:200000 IJ SOLN
INTRAMUSCULAR | Status: AC
  Filled 2021-08-04: qty 30

## 2021-08-04 MED ORDER — OXYCODONE HCL 5 MG PO TABS
5.0000 mg | ORAL_TABLET | ORAL | Status: DC | PRN
  Administered 2021-08-04 – 2021-08-05 (×3): 5 mg via ORAL
  Filled 2021-08-04 (×3): qty 1

## 2021-08-04 MED ORDER — KETOROLAC TROMETHAMINE 15 MG/ML IJ SOLN
7.5000 mg | Freq: Three times a day (TID) | INTRAMUSCULAR | Status: DC
  Administered 2021-08-05: 7.5 mg via INTRAVENOUS
  Filled 2021-08-04: qty 1

## 2021-08-04 MED ORDER — ONDANSETRON HCL 4 MG/2ML IJ SOLN
INTRAMUSCULAR | Status: AC
  Filled 2021-08-04: qty 2

## 2021-08-04 MED ORDER — FENTANYL CITRATE (PF) 100 MCG/2ML IJ SOLN
INTRAMUSCULAR | Status: DC | PRN
  Administered 2021-08-04 (×3): 50 ug via INTRAVENOUS

## 2021-08-04 MED ORDER — ACETAMINOPHEN 160 MG/5ML PO SOLN
325.0000 mg | ORAL | Status: DC | PRN
  Filled 2021-08-04: qty 20.3

## 2021-08-04 MED ORDER — CEFAZOLIN SODIUM-DEXTROSE 2-4 GM/100ML-% IV SOLN
INTRAVENOUS | Status: AC
  Filled 2021-08-04: qty 100

## 2021-08-04 MED ORDER — KETOROLAC TROMETHAMINE 15 MG/ML IJ SOLN
15.0000 mg | Freq: Once | INTRAMUSCULAR | Status: AC
  Administered 2021-08-04: 15 mg via INTRAVENOUS

## 2021-08-04 MED ORDER — MORPHINE SULFATE (PF) 2 MG/ML IV SOLN: 2.0000 mg | INTRAVENOUS | Status: DC | PRN

## 2021-08-04 MED ORDER — LIDOCAINE HCL (CARDIAC) PF 100 MG/5ML IV SOSY
PREFILLED_SYRINGE | INTRAVENOUS | Status: DC | PRN
  Administered 2021-08-04: 60 mg via INTRAVENOUS

## 2021-08-04 MED ORDER — 0.9 % SODIUM CHLORIDE (POUR BTL) OPTIME
TOPICAL | Status: DC | PRN
  Administered 2021-08-04: 500 mL

## 2021-08-04 MED ORDER — PHENYLEPHRINE HCL (PRESSORS) 10 MG/ML IV SOLN
INTRAVENOUS | Status: AC
  Filled 2021-08-04: qty 1

## 2021-08-04 MED ORDER — DEXAMETHASONE SODIUM PHOSPHATE 10 MG/ML IJ SOLN
INTRAMUSCULAR | Status: DC | PRN
  Administered 2021-08-04: 10 mg via INTRAVENOUS

## 2021-08-04 MED ORDER — PROMETHAZINE HCL 25 MG/ML IJ SOLN: 12.5000 mg | Freq: Once | INTRAMUSCULAR | Status: DC | PRN

## 2021-08-04 MED ORDER — ONDANSETRON HCL 4 MG/2ML IJ SOLN
INTRAMUSCULAR | Status: AC
  Administered 2021-08-04: 4 mg
  Filled 2021-08-04: qty 2

## 2021-08-04 SURGICAL SUPPLY — 46 items
BINDER ABDOMINAL  9 SM 30-45 (SOFTGOODS) ×1
BINDER ABDOMINAL 9 SM 30-45 (SOFTGOODS) ×1 IMPLANT
BLADE SURG 11 STRL SS SAFETY (MISCELLANEOUS) ×2 IMPLANT
CANNULA DILATOR 10 W/SLV (CANNULA) ×2 IMPLANT
CANNULA DILATOR 5 W/SLV (CANNULA) ×4 IMPLANT
CHLORAPREP W/TINT 26 (MISCELLANEOUS) ×2 IMPLANT
CORD MONOPOLAR M/FML 12FT (MISCELLANEOUS) ×2 IMPLANT
DEVICE SECURE STRAP 25 ABSORB (INSTRUMENTS) ×2 IMPLANT
DRSG TEGADERM 2-3/8X2-3/4 SM (GAUZE/BANDAGES/DRESSINGS) ×6 IMPLANT
DRSG TELFA 4X3 1S NADH ST (GAUZE/BANDAGES/DRESSINGS) ×2 IMPLANT
ELECT CAUTERY BLADE 6.4 (BLADE) ×2 IMPLANT
ELECT REM PT RETURN 9FT ADLT (ELECTROSURGICAL) ×2
ELECTRODE REM PT RTRN 9FT ADLT (ELECTROSURGICAL) ×1 IMPLANT
GAUZE 4X4 16PLY ~~LOC~~+RFID DBL (SPONGE) ×2 IMPLANT
GLOVE SURG ENC MOIS LTX SZ7.5 (GLOVE) ×2 IMPLANT
GLOVE SURG UNDER LTX SZ8 (GLOVE) ×2 IMPLANT
GOWN STRL REUS W/ TWL LRG LVL3 (GOWN DISPOSABLE) ×2 IMPLANT
GOWN STRL REUS W/TWL LRG LVL3 (GOWN DISPOSABLE) ×2
GRASPER SUT TROCAR 14GX15 (MISCELLANEOUS) ×2 IMPLANT
IRRIGATION STRYKERFLOW (MISCELLANEOUS) IMPLANT
IRRIGATOR STRYKERFLOW (MISCELLANEOUS)
IV LACTATED RINGERS 1000ML (IV SOLUTION) IMPLANT
KIT TURNOVER KIT A (KITS) ×2 IMPLANT
LABEL OR SOLS (LABEL) ×2 IMPLANT
LIGASURE LAP MARYLAND 5MM 37CM (ELECTROSURGICAL) ×2 IMPLANT
MANIFOLD NEPTUNE II (INSTRUMENTS) ×2 IMPLANT
MESH VENT LT ST 10.2X15.2CM EL (Mesh General) ×2 IMPLANT
NDL INSUFF ACCESS 14 VERSASTEP (NEEDLE) ×2 IMPLANT
NS IRRIG 500ML POUR BTL (IV SOLUTION) ×2 IMPLANT
PACK LAP CHOLECYSTECTOMY (MISCELLANEOUS) ×2 IMPLANT
PENCIL ELECTRO HAND CTR (MISCELLANEOUS) ×2 IMPLANT
SCISSORS METZENBAUM CVD 33 (INSTRUMENTS) ×2 IMPLANT
SET TUBE SMOKE EVAC HIGH FLOW (TUBING) ×2 IMPLANT
SHEARS HARMONIC ACE PLUS 36CM (ENDOMECHANICALS) IMPLANT
SPONGE KITTNER 5P (MISCELLANEOUS) IMPLANT
STRIP CLOSURE SKIN 1/2X4 (GAUZE/BANDAGES/DRESSINGS) ×2 IMPLANT
SUT MAXON 0 T 12 3 (SUTURE) ×8 IMPLANT
SUT PDS PLUS 0 (SUTURE) ×1
SUT PDS PLUS AB 0 CT-2 (SUTURE) ×1 IMPLANT
SUT VIC AB 0 SH 27 (SUTURE) ×2 IMPLANT
SUT VIC AB 4-0 FS2 27 (SUTURE) ×2 IMPLANT
SWABSTK COMLB BENZOIN TINCTURE (MISCELLANEOUS) ×2 IMPLANT
TRAY FOLEY MTR SLVR 16FR STAT (SET/KITS/TRAYS/PACK) IMPLANT
TROCAR XCEL 12X100 BLDLESS (ENDOMECHANICALS) ×2 IMPLANT
TROCAR XCEL UNIV SLVE 11M 100M (ENDOMECHANICALS) ×2 IMPLANT
WATER STERILE IRR 500ML POUR (IV SOLUTION) ×2 IMPLANT

## 2021-08-04 NOTE — Anesthesia Procedure Notes (Signed)
Procedure Name: Intubation Date/Time: 08/04/2021 12:24 PM Performed by: Biagio Borg, CRNA Pre-anesthesia Checklist: Patient identified, Emergency Drugs available, Suction available and Patient being monitored Patient Re-evaluated:Patient Re-evaluated prior to induction Oxygen Delivery Method: Circle system utilized Preoxygenation: Pre-oxygenation with 100% oxygen Induction Type: IV induction Ventilation: Mask ventilation without difficulty Laryngoscope Size: McGraph and 3 Grade View: Grade I Tube type: Oral Tube size: 7.0 mm Number of attempts: 1 Airway Equipment and Method: Stylet Placement Confirmation: ETT inserted through vocal cords under direct vision, positive ETCO2 and breath sounds checked- equal and bilateral Tube secured with: Tape Dental Injury: Teeth and Oropharynx as per pre-operative assessment

## 2021-08-04 NOTE — Op Note (Signed)
Preoperative diagnosis: Left lower quadrant port site hernia with colon; right mid/upper abdomen abdominal wall hernia with omentum.  Postoperative diagnosis: Same.  Operative procedure: Laparoscopy, reduction of right lateral abdominal wall hernia with primary repair, transfascial 0 Maxon sutures; reduction of sigmoid colon from the port site hernia in the left lower quadrant with single stitch primary closure and 4 x 6 Ventralex echo mesh placement.  Operative surgeon: Hervey Ard, MD.  Assistant: Elsie Stain, RNFA.  Anesthesia: General endotracheal, Marcaine 0.5% with 1: 200,000 notes of epinephrine.  Estimated blood loss: 20 cc.  Clinical note: This 75 year old woman has developed a bulge in the left lower quadrant suggestive of a port site hernia from a prior GYN procedure.  CT scan showed a contralateral defect as well.  She is becoming increasingly symptomatic with enlargement of the left lower quadrant hernia and she was felt to be a candidate for repair.  She received Ancef prior to the procedure.  SCD stockings for DVT prevention.  Operative note: With the patient under adequate general anesthesia the abdomen was cleansed with ChloraPrep and draped.  The left lower quadrant hernia was not reducible.  A varies needle was placed returns umbilical incision after assuring intra-abdominal location with a hanging drop test the abdomen was insufflated with CO2 at 10 mmHg pressure.  A 10 mm Neck step port was expanded and inspection showed no evidence of injury from initial port placement.  2-5 mm Neck step ports were then placed one in the epigastrium just medial to the falciform ligament and the other in the hypogastrium.  Both were placed under direct vision.  The right side was approached first.  There was a loop of colon adherent to the anterior abdominal wall and through a 1 cm fascial defect approximately 6 inches of omentum had squirreled  its way into the hernia.  This was gently  reduced.  Hemostasis was with the use of LigaSure.  Dissection around the colon was completed with scissors without cautery.  After this was reduced 3-0 Maxon transvaginal sutures were placed to obliterate the defect.  Attention was turned to the left side.  Here the colon proximal sigmoid and distal descending was in the hernia sac as well as omentum.  Making use of endoscopic Babcock clamp this was gently placed on traction and manipulated to reduce the bowel.  The omentum was retracted as well.  Several small pieces of omentum were removed after hemostasis with the LigaSure device.  This defect was about 3 cm in diameter.  There was a softball sized hernia sac which was left in situ.  A single 0 Maxon transvaginal suture was used to approximate the fascia at the opening.  A 4 x 6 cm ventral light mesh with the echo placement system was then put into the abdominal cavity having swapped the 10 mm Neck step port for a 12 mm XL port.  This was orientated longitudinally.  This was tacked circumferentially into circles around the mesh and then transfixing sutures at the superior, medial and inferior aspect were placed.  The lateral aspect this abutted the iliac spine.  The only bleeding in the case came from the transvaginal suture at the inferior aspect of the mesh or a muscular vessel was perforated and this was controlled by placing a suture ligature around this with the PMI passer with good control.  The abdomen was irrigated with saline and no further bleeding was noted.  The hematoma which was about the size of a quarter did  not expand drained last 20 minutes of the procedure.  Local anesthesia was injected at all the port site as well as near the site where the hematoma had developed.  The abdomen was desufflated under direct vision.  The fascia at the umbilicus was closed with a 0 PDS figure-of-eight suture.  Skin incisions were closed with 4-0 Vicryl subcuticular sutures.  The transvaginal suture sites  was closed with benzoin and Steri-Strips.  Telfa and Tegaderm dressings over the port sites as well as the right lateral abdominal wall repair site.  ABD and towels followed by an abdominal binder were placed.  Patient tolerated the procedure well and was taken to recovery in stable condition.

## 2021-08-04 NOTE — Progress Notes (Signed)
Patient has required IV narcotics for pain control. Will admit to observation overnight.

## 2021-08-04 NOTE — H&P (Signed)
CYRA SPADER [id number] 1946-02-07     HPI: Patient with a symptomatic left lower quadrant hernia that is symptomatic.  Abnormal CT findings of the right abdominal wall     Medications Prior to Admission  Medication Sig Dispense Refill Last Dose   acetaminophen (TYLENOL) 500 MG tablet Take 1,000 mg by mouth every 6 (six) hours as needed for mild pain.   08/03/2021   amLODipine (NORVASC) 5 MG tablet Take 5 mg by mouth daily.   08/04/2021 at 0730   meloxicam (MOBIC) 15 MG tablet Take 15 mg by mouth daily.   07/28/2021   Multiple Minerals-Vitamins (CAL-MAG-ZINC-D PO) Take 1 tablet by mouth daily.   07/28/2021   omeprazole (PRILOSEC) 20 MG capsule Take 20 mg by mouth daily as needed (heartburn).   08/04/2021 at 0730   traMADol (ULTRAM) 50 MG tablet Take 50 mg by mouth daily.   08/04/2021 at 0730   valsartan (DIOVAN) 160 MG tablet Take 160 mg by mouth daily.      Vitamin D3 (VITAMIN D) 25 MCG tablet Take 1,000 Units by mouth daily.   07/28/2021   Allergies  Allergen Reactions   Oxycodone-Acetaminophen Other (See Comments)    Other Reaction: GI Upset   Sulfa Antibiotics Itching   Celecoxib Diarrhea    Other reaction(s): diarrhea   Statins     Other reaction(s): muscle aches   Past Medical History:  Diagnosis Date   Anxiety    Arthritis    GERD (gastroesophageal reflux disease)    Hypertension    Osteoporosis    Past Surgical History:  Procedure Laterality Date   ABDOMINAL HYSTERECTOMY     BREAST BIOPSY Left    neg   COLONOSCOPY     Social History   Socioeconomic History   Marital status: Married    Spouse name: Charles   Number of children: Not on file   Years of education: Not on file   Highest education level: Not on file  Occupational History   Not on file  Tobacco Use   Smoking status: Never   Smokeless tobacco: Never  Substance and Sexual Activity   Alcohol use: Never   Drug use: Never   Sexual activity: Not on file  Other Topics Concern   Not on file   Social History Narrative   Not on file   Social Determinants of Health   Financial Resource Strain: Not on file  Food Insecurity: Not on file  Transportation Needs: Not on file  Physical Activity: Not on file  Stress: Not on file  Social Connections: Not on file  Intimate Partner Violence: Not on file   Social History   Social History Narrative   Not on file     ROS: Negative.     PE: HEENT: Negative. Lungs: Clear. Cardio: RRForest Gleason Ervine Witucki 08/04/2021

## 2021-08-04 NOTE — Anesthesia Preprocedure Evaluation (Addendum)
Anesthesia Evaluation  Patient identified by MRN, date of birth, ID band Patient awake    Reviewed: Allergy & Precautions, H&P , NPO status , Patient's Chart, lab work & pertinent test results  History of Anesthesia Complications (+) history of anesthetic complications (sore throat post-op, bradycardia)  Airway Mallampati: II  TM Distance: <3 FB Neck ROM: Full    Dental   Pulmonary neg pulmonary ROS,    Pulmonary exam normal        Cardiovascular Exercise Tolerance: Good hypertension, Pt. on medications  Rhythm:Regular Rate:Normal + Systolic murmurs    Neuro/Psych  Neuromuscular disease (Lumbar degenerative disc disease. Chronic pain syndrome) negative psych ROS   GI/Hepatic Neg liver ROS, GERD  Medicated and Controlled,  Endo/Other  negative endocrine ROS  Renal/GU negative Renal ROS  negative genitourinary   Musculoskeletal  (+) Arthritis  (back),   Osteoporosis    Abdominal   Peds negative pediatric ROS (+)  Hematology negative hematology ROS (+)   Anesthesia Other Findings bilateral port site hernias  Reproductive/Obstetrics negative OB ROS                            Anesthesia Physical Anesthesia Plan  ASA: 2  Anesthesia Plan: General   Post-op Pain Management:    Induction:   PONV Risk Score and Plan:   Airway Management Planned:   Additional Equipment:   Intra-op Plan:   Post-operative Plan:   Informed Consent: I have reviewed the patients History and Physical, chart, labs and discussed the procedure including the risks, benefits and alternatives for the proposed anesthesia with the patient or authorized representative who has indicated his/her understanding and acceptance.     Dental advisory given  Plan Discussed with: CRNA  Anesthesia Plan Comments: (Careful with moving and positioning due to back pain.  Gentle intubation with McGrath, smaller ETT due to  h/o sore throat)       Anesthesia Quick Evaluation

## 2021-08-04 NOTE — Transfer of Care (Signed)
Immediate Anesthesia Transfer of Care Note  Patient: Gabriela Cohen  Procedure(s) Performed: REPAIR OF RIGHT ABDOMINAL WALL HERNIA-PRIMARY REPAIR REPAIR OF LEFT LOWER QUADRANT HERNIA REPAIR WITH MESH (Abdomen)  Patient Location: PACU  Anesthesia Type:General  Level of Consciousness: awake  Airway & Oxygen Therapy: Patient Spontanous Breathing  Post-op Assessment: Report given to RN and Post -op Vital signs reviewed and stable  Post vital signs: Reviewed and stable  Last Vitals:  Vitals Value Taken Time  BP 95/71 08/04/21 1441  Temp    Pulse 80 08/04/21 1444  Resp 22 08/04/21 1444  SpO2 90 % 08/04/21 1444  Vitals shown include unvalidated device data.  Last Pain:  Vitals:   08/04/21 1041  TempSrc: Temporal  PainSc: 0-No pain         Complications: No notable events documented.

## 2021-08-05 ENCOUNTER — Encounter: Payer: Self-pay | Admitting: General Surgery

## 2021-08-05 DIAGNOSIS — K439 Ventral hernia without obstruction or gangrene: Secondary | ICD-10-CM | POA: Diagnosis not present

## 2021-08-05 MED ORDER — ACETAMINOPHEN 325 MG PO TABS
650.0000 mg | ORAL_TABLET | ORAL | Status: DC
  Administered 2021-08-05: 650 mg via ORAL
  Filled 2021-08-05 (×2): qty 2

## 2021-08-05 MED ORDER — OXYCODONE HCL 5 MG PO TABS: 5.0000 mg | ORAL_TABLET | Freq: Four times a day (QID) | ORAL | 0 refills | Status: AC | PRN

## 2021-08-05 MED ORDER — MELOXICAM 7.5 MG PO TABS
15.0000 mg | ORAL_TABLET | Freq: Every day | ORAL | Status: DC
  Administered 2021-08-05: 15 mg via ORAL
  Filled 2021-08-05: qty 2

## 2021-08-05 NOTE — Final Progress Note (Signed)
Doing very well this afternoon. Has ambulated well. PT evaluated and recommended a cane for short term stability. Care management assessment noted.  ABD: Soft.  Plan: Home.  Reviewed restrictions and importance of binder in the short term.  Will continue daily Miralax to minimize constipation secondary to narcotics.

## 2021-08-05 NOTE — Care Management (Signed)
Cane and BSC to be delivered to room prior to discharge by adapt representative

## 2021-08-05 NOTE — Evaluation (Signed)
Physical Therapy Evaluation Patient Details Name: Gabriela Cohen MRN: [id number] DOB: 05/31/1946 Today's Date: 08/05/2021  History of Present Illness  Pt is a 75 yo female s/p laparoscopy, reduction of right lateral abdominal wall hernia with primary repair 11/30. PMH of anxiety, GERD, HTN, osteoporosis.   Clinical Impression  Patient alert, agreeable to PT, Oriented x4. Reported abdominal pain as 4-6/10 during session, RN notified of pt request for pain medication at end of session. Pt instructed in splinting to assist with pain for coughing, log roll technique to get in/out of bed to decrease abdominal strain, as well as abdominal binder adjustment. The patient was able to ambulate >269ft with handheld assist due to some mild instability noted/decreased confidence. She also navigated 5 steps twice with R hand rail to simulate home set up, handheld assist and CGA, no true physical assist needed. DME needs discussed, pt would benefit from [facility] s/p surgery to improve safety and independence. At this time no PT follow recommended at this time, anticipate full return to PLOF as abdominal pain decreases.      Recommendations for follow up therapy are one component of a multi-disciplinary discharge planning process, led by the attending physician.  Recommendations may be updated based on patient status, additional functional criteria and insurance authorization.  Follow Up Recommendations No PT follow up    Assistance Recommended at Discharge PRN  Functional Status Assessment  (pt with recent surgery limiting mobility, anticipate return to PLOF within days)  Equipment Recommendations  BSC/3in1;Cane    Recommendations for Other Services       Precautions / Restrictions Restrictions Weight Bearing Restrictions: No Other Position/Activity Restrictions: abdominal binder      Mobility  Bed Mobility Overal bed mobility: Needs Assistance Bed Mobility: Rolling;Sidelying to Sit;Sit to  Sidelying Rolling: Supervision Sidelying to sit: Supervision Supine to sit: Min guard   Sit to sidelying: Supervision      Transfers Overall transfer level: Needs assistance Equipment used: None Transfers: Sit to/from Stand Sit to Stand: Min guard           General transfer comment: handheld assist    Ambulation/Gait Ambulation/Gait assistance: Supervision   Assistive device: 1 person hand held assist   Gait velocity: decreased     General Gait Details: >243ft  Stairs Stairs: Yes Stairs assistance: Min guard Stair Management: One rail Right Number of Stairs: 10    Wheelchair Mobility    Modified Rankin (Stroke Patients Only)       Balance Overall balance assessment: Needs assistance Sitting-balance support: Feet supported Sitting balance-Leahy Scale: Good       Standing balance-Leahy Scale: Good                               Pertinent Vitals/Pain Pain Assessment: Faces Faces Pain Scale: Hurts even more Pain Location: with transitional movements Pain Descriptors / Indicators: Aching Pain Intervention(s): Limited activity within patient's tolerance;Premedicated before session;Repositioned;Patient requesting pain meds-RN notified    Home Living Family/patient expects to be discharged to:: Private residence Living Arrangements: Spouse/significant other Available Help at Discharge: Family Type of Home: House Home Access: Stairs to enter Entrance Stairs-Rails: Psychiatric nurse of Steps: 5 Alternate Level Stairs-Number of Steps: 10 Home Layout: Two level;Bed/bath upstairs Home Equipment: None      Prior Function Prior Level of Function : Independent/Modified Independent  Hand Dominance   Dominant Hand: Right    Extremity/Trunk Assessment   Upper Extremity Assessment Upper Extremity Assessment: Overall WFL for tasks assessed    Lower Extremity Assessment Lower Extremity  Assessment: Overall WFL for tasks assessed    Cervical / Trunk Assessment Cervical / Trunk Assessment: Normal (abdominal binder in place throughout)  Communication   Communication: No difficulties  Cognition Arousal/Alertness: Awake/alert Behavior During Therapy: WFL for tasks assessed/performed Overall Cognitive Status: Within Functional Limits for tasks assessed                                          General Comments      Exercises     Assessment/Plan    PT Assessment Patient does not need any further PT services  PT Problem List Decreased activity tolerance;Decreased strength;Decreased mobility       PT Treatment Interventions DME instruction;Therapeutic exercise;Gait training;Balance training;Stair training;Neuromuscular re-education;Functional mobility training;Therapeutic activities;Patient/family education    PT Goals (Current goals can be found in the Care Plan section)  Acute Rehab PT Goals Patient Stated Goal: to go home PT Goal Formulation: With patient Time For Goal Achievement: 08/19/21 Potential to Achieve Goals: Good    Frequency     Barriers to discharge        Co-evaluation               AM-PAC PT "6 Clicks" Mobility  Outcome Measure Help needed turning from your back to your side while in a flat bed without using bedrails?: None Help needed moving from lying on your back to sitting on the side of a flat bed without using bedrails?: None Help needed moving to and from a bed to a chair (including a wheelchair)?: None Help needed standing up from a chair using your arms (e.g., wheelchair or bedside chair)?: None Help needed to walk in hospital room?: None Help needed climbing 3-5 steps with a railing? : A Little 6 Click Score: 23    End of Session Equipment Utilized During Treatment: Gait belt Activity Tolerance: Patient tolerated treatment well Patient left: with call bell/phone within reach (on [facility], CNA and RN  aware) Nurse Communication: Mobility status PT Visit Diagnosis: Other abnormalities of gait and mobility (R26.89)    Time: 2841-3244 PT Time Calculation (min) (ACUTE ONLY): 28 min   Charges:   PT Evaluation $PT Eval Low Complexity: 1 Low PT Treatments $Gait Training: 8-22 mins $Therapeutic Activity: 8-22 mins       Lieutenant Diego PT, DPT 3:17 PM,08/05/21

## 2021-08-05 NOTE — Plan of Care (Signed)

## 2021-08-05 NOTE — Progress Notes (Signed)
Tmax 99.7 last night, afebrile this AM. Feeling much better re: pain control.  No flatus yet. No nausea. Interested in diet advance. Lungs: Clear. Cardio: RR. ABD: Soft. BS+. Reviewed operative findings.  IMP: Doing well.  Plan: Ambulate, change IV to saline lock, tentative D/C this afternoon.

## 2021-08-06 NOTE — Discharge Summary (Signed)
Physician Discharge Summary  Patient ID: Gabriela Cohen MRN: [id number] DOB/AGE: November 16, 1945 75 y.o.  Admit date: 08/04/2021 Discharge date: 08/06/2021  Admission Diagnoses: Bilateral abdominal wall port site hernias  Discharge Diagnoses:  Principal Problem:   Ventral hernia   Discharged Condition: good  Hospital Course: Significant pain in the first 8 hours postop.  Did well with IV Tylenol and Toradol.  No adverse Rx to Oxycodone.  Consults: None  Significant Diagnostic Studies: None  Treatments: IV hydration and analgesia  Discharge Exam: Blood pressure (!) 136/52, pulse 74, temperature 98.5 F (36.9 C), resp. rate 16, height 5\' 5"  (1.651 m), weight 65.8 kg, SpO2 93 %. General appearance: alert and cooperative Resp: clear to auscultation bilaterally Cardio: regular rate and rhythm, S1, S2 normal, no murmur, click, rub or gallop GI: soft, non-tender; bowel sounds normal; no masses,  no organomegaly  Disposition: Discharge disposition: 01-Home or Self Care       Discharge Instructions     Diet - low sodium heart healthy   Complete by: As directed    Discharge instructions   Complete by: As directed    Keep the abdominal binder on for three days.  NO showers during this time.  Diet as tolerated.  Tylenol: If needed for soreness.   Ultram (tramadol): If needed for pain.   Laxative of choice if needed.  No driving until pain free.  Avoid lifting over 10 pounds.  Heating pad to abdomen for comfort.  You may remove the binder on Saturday to shower, then resume wearing the binder for support.  A clean hand towel between the binder and your skin will be comforting.   Discharge instructions   Complete by: As directed    Keep the compressive wrap in place until Saturday. You may remove it at that time to shower.  You should continue to wear it day and night until your office follow up. (This provides support for you hernia repairs).  Tylenol: If needed for  soreness.  Continue your regular Mobic (Meloxicam).  Tramadol: If needed for moderate pain.  Oxycodone: If needed for severe pain.  This medication frequently causes constipation.  Continue daily Miralax: 1 capful in a full glass of water each day.  Laxative of choice if you have not had a good bowel movement by Saturday, 08/07/21.  Leave the steri strips (butterfly dressings) on until they fall off.  The plastic dressings may be removed on Saturday if desired.  No lifting over 10 pounds.   No driving until pain free.  Use your cane for balance.   Increase activity slowly   Complete by: As directed    Increase activity slowly   Complete by: As directed    No wound care   Complete by: As directed    No wound care   Complete by: As directed       Allergies as of 08/05/2021       Reactions   Oxycodone-acetaminophen Other (See Comments)   Other Reaction: GI Upset   Sulfa Antibiotics Itching   Celecoxib Diarrhea   Other reaction(s): diarrhea   Statins    Other reaction(s): muscle aches        Medication List     TAKE these medications    acetaminophen 500 MG tablet Commonly known as: TYLENOL Take 1,000 mg by mouth every 6 (six) hours as needed for mild pain.   amLODipine 5 MG tablet Commonly known as: NORVASC Take 5 mg by mouth daily.   CAL-MAG-ZINC-D  PO Take 1 tablet by mouth daily.   meloxicam 15 MG tablet Commonly known as: MOBIC Take 15 mg by mouth daily.   omeprazole 20 MG capsule Commonly known as: PRILOSEC Take 20 mg by mouth daily as needed (heartburn).   oxyCODONE 5 MG immediate release tablet Commonly known as: Oxy IR/ROXICODONE Take 1 tablet (5 mg total) by mouth every 6 (six) hours as needed for up to 5 days for severe pain.   traMADol 50 MG tablet Commonly known as: ULTRAM Take 50 mg by mouth daily.   valsartan 160 MG tablet Commonly known as: DIOVAN Take 160 mg by mouth daily.   Vitamin D3 25 MCG tablet Commonly known as:  Vitamin D Take 1,000 Units by mouth daily.        Follow-up Information     Dale Strausser, Forest Gleason, MD. Go on 08/12/2021.   Specialties: General Surgery, Radiology Why: 11:30am appointment Contact information: [place] [address] [phone]                 Signed: Robert Bellow 08/06/2021, 11:35 AM

## 2021-08-10 NOTE — Anesthesia Postprocedure Evaluation (Signed)
Anesthesia Post Note  Patient: Gabriela Cohen  Procedure(s) Performed: REPAIR OF RIGHT ABDOMINAL WALL HERNIA-PRIMARY REPAIR REPAIR OF LEFT LOWER QUADRANT HERNIA REPAIR WITH MESH (Abdomen)  Patient location during evaluation: PACU Anesthesia Type: General Level of consciousness: awake and alert Pain management: pain level controlled Vital Signs Assessment: post-procedure vital signs reviewed and stable Respiratory status: spontaneous breathing, nonlabored ventilation, respiratory function stable and patient connected to nasal cannula oxygen Cardiovascular status: blood pressure returned to baseline and stable Postop Assessment: no apparent nausea or vomiting Anesthetic complications: no   No notable events documented.   Last Vitals:  Vitals:   08/05/21 0513 08/05/21 0756  BP: (!) 127/55 (!) 136/52  Pulse: 83 74  Resp: 18 16  Temp: 37.6 C 36.9 C  SpO2: 92% 93%    Last Pain:  Vitals:   08/05/21 1546  TempSrc:   PainSc: [place]   Late entry

## 2021-09-22 IMAGING — CT CT ABD-PELV W/O CM
2 of 5 series · 16 of 46 positions shown, 18 images · non-contrast
Comparison: None.

CLINICAL DATA: Left lower quadrant pain

EXAM:
CT ABDOMEN AND PELVIS WITHOUT CONTRAST
TECHNIQUE: Multidetector CT imaging of the abdomen and pelvis was performed
following the standard protocol without IV contrast.

[Series 2: routine abd/pel wo · axial · 0.78mm/px · z∈[-586,-186]mm · 13 of 90 slices shown, 15 images]
[im 5/90  soft-tissue]
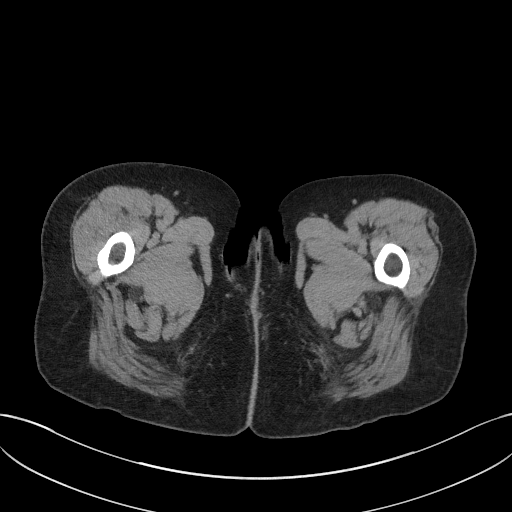
[im 5/90  bone]
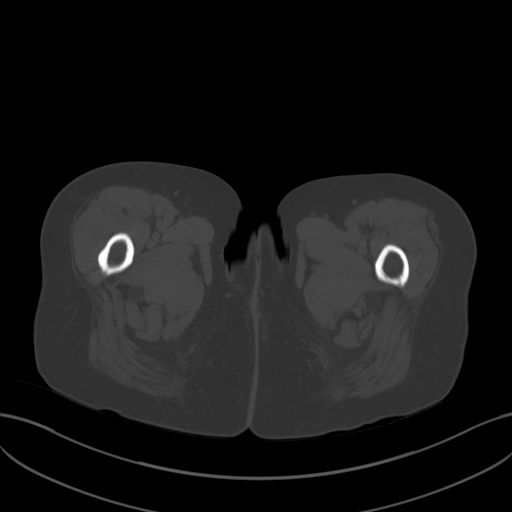
[im 14/90  soft-tissue]
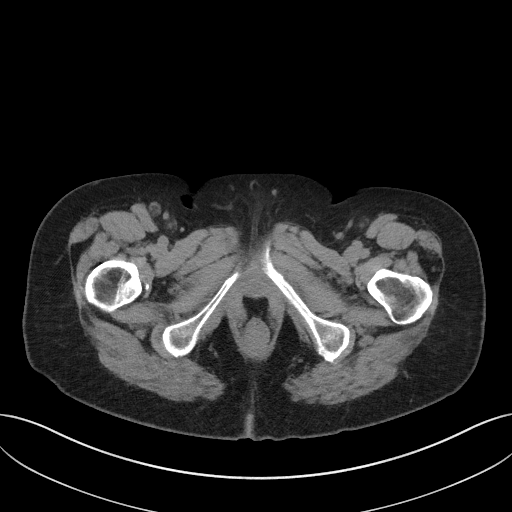
[im 18/90  soft-tissue]
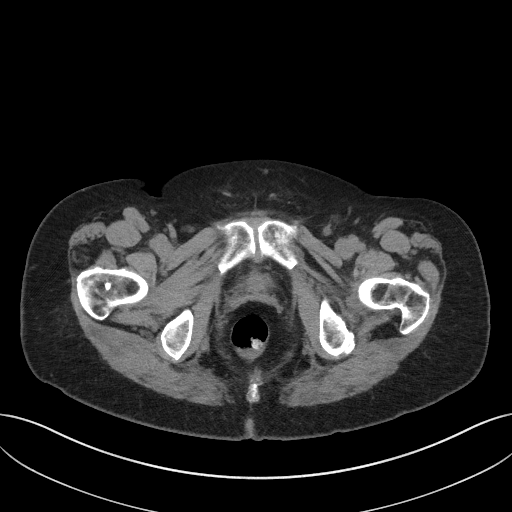
[im 27/90  soft-tissue]
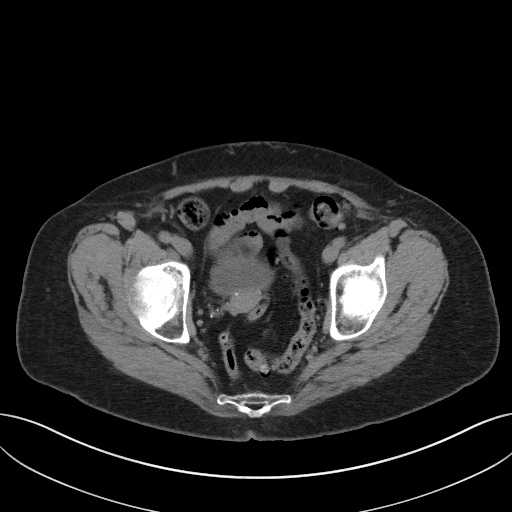
[im 32/90  soft-tissue]
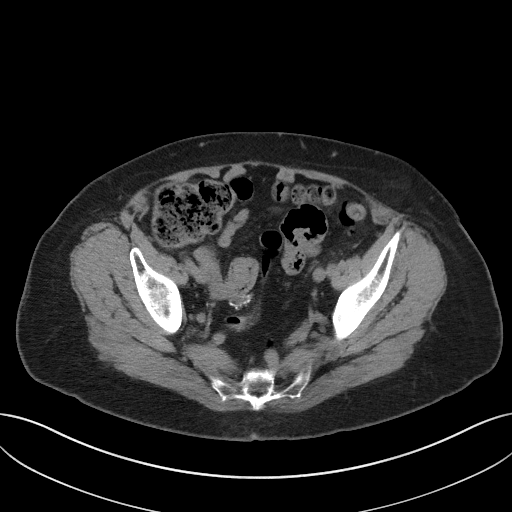
[im 41/90  soft-tissue]
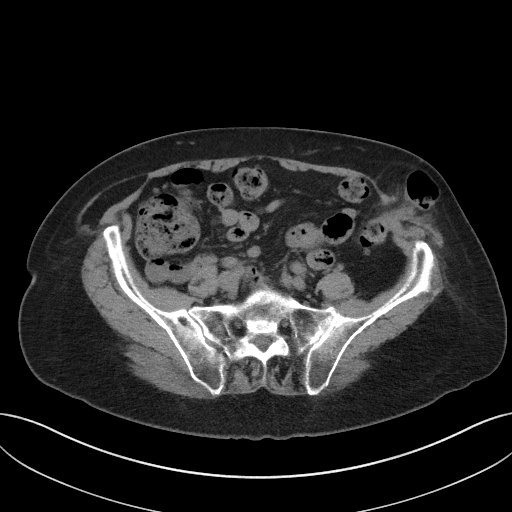
[im 45/90  soft-tissue]
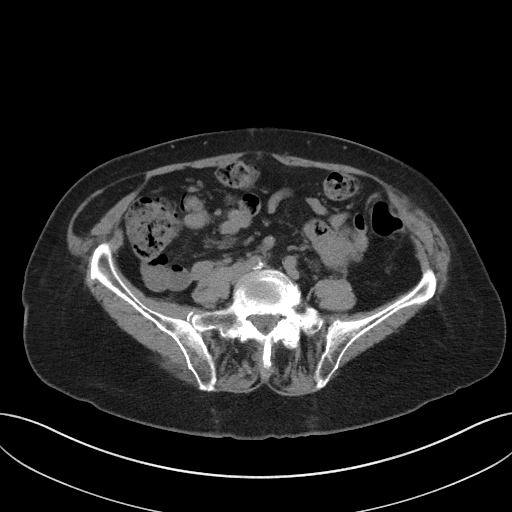
[im 49/90  soft-tissue]
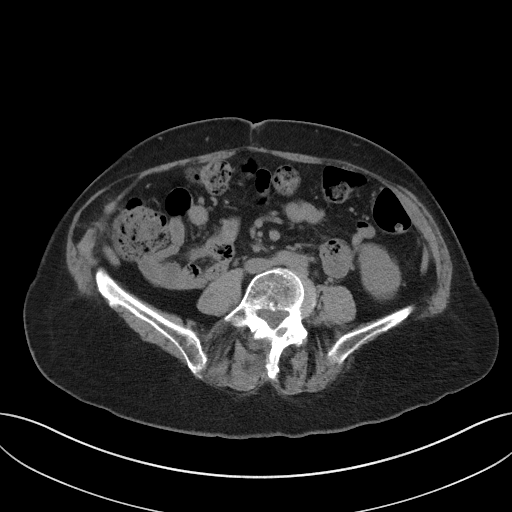
[im 58/90  soft-tissue]
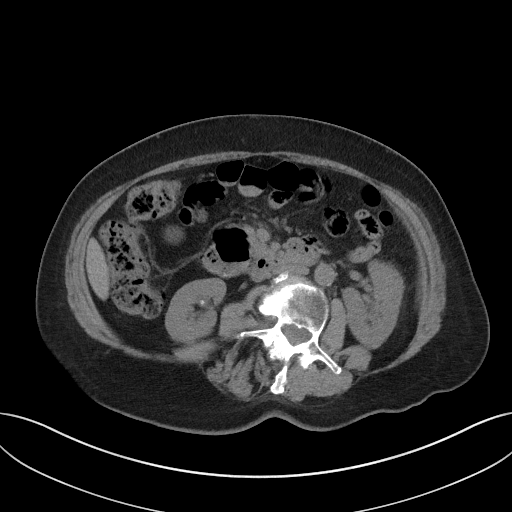
[im 58/90  bone]
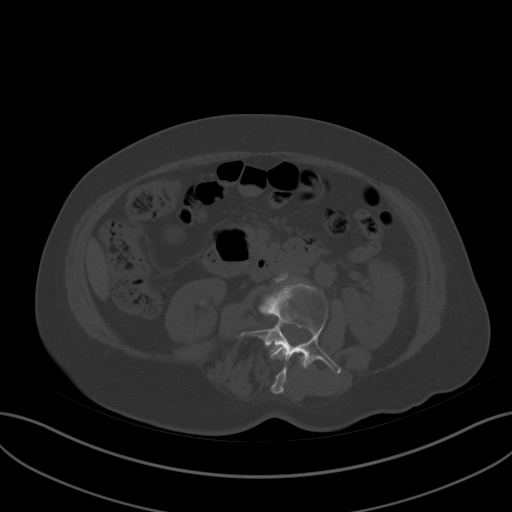
[im 63/90  soft-tissue]
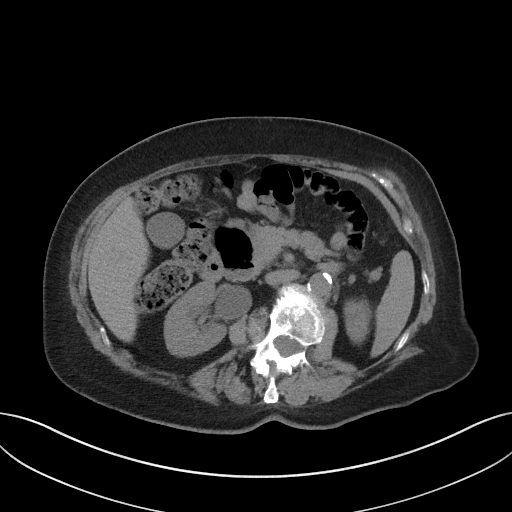
[im 72/90  soft-tissue]
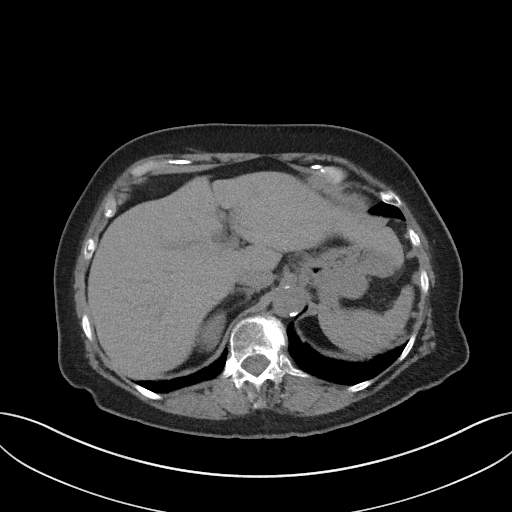
[im 76/90  soft-tissue]
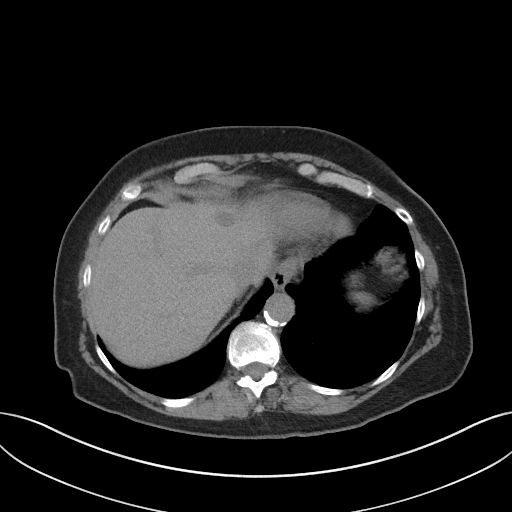
[im 85/90  soft-tissue]
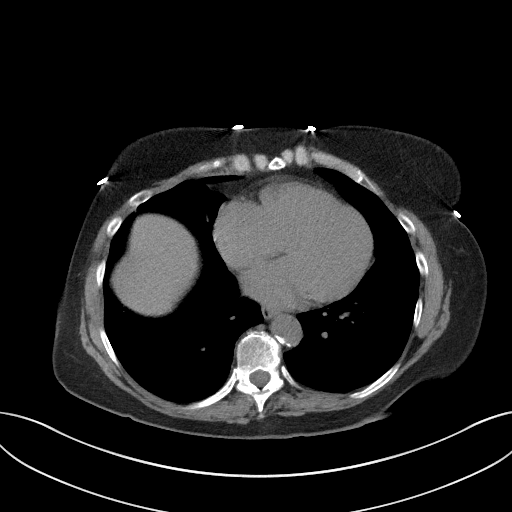

[Series 6: coronal st · coronal · 0.75mm/px · 3 of 86 slices shown]
[im 29/86  soft-tissue]
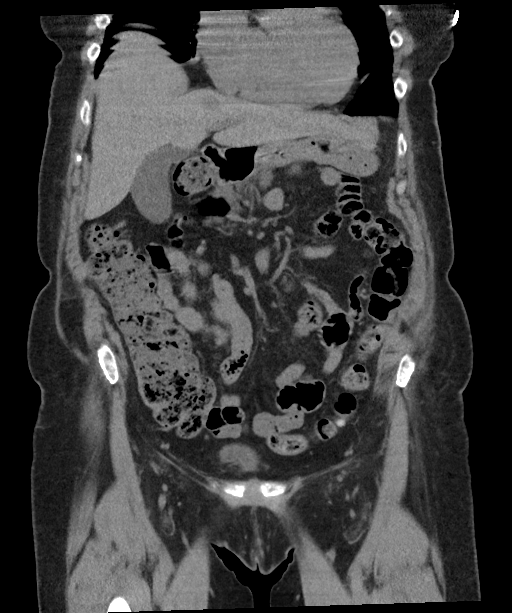
[im 38/86  soft-tissue]
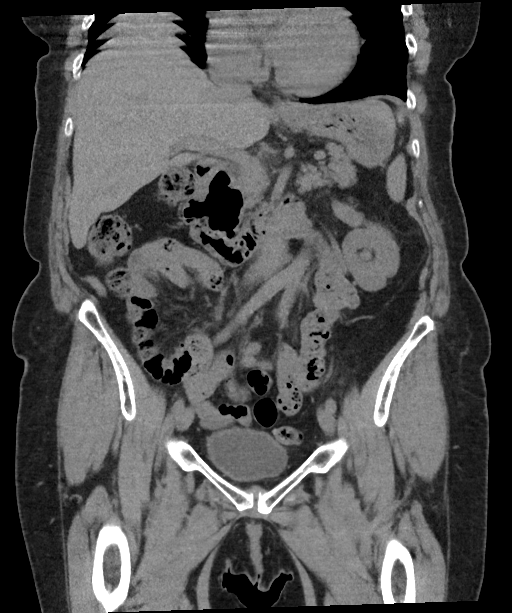
[im 48/86  soft-tissue]
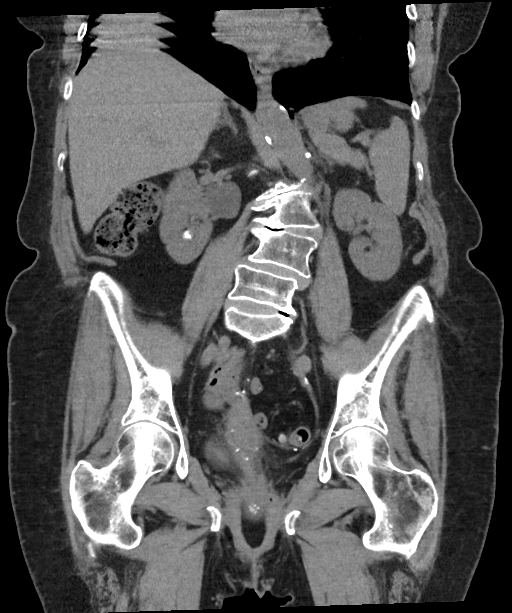

[16 of 46 positions shown; findings below may reference images not displayed]

FINDINGS: Lower chest: No acute abnormality.

Hepatobiliary: Indeterminate mildly hypoattenuating 1.5 cm lesion of
the superior left hepatic lobe. Gallbladder is unremarkable. No
biliary dilatation.

Pancreas: Unremarkable.

Spleen: Unremarkable.

Adrenals/Urinary Tract: Adrenals are unremarkable. Nonobstructing 1
cm calculus at the lower pole of the right kidney. Bladder is
unremarkable.

Stomach/Bowel: Stomach is within normal limits. A duodenal
diverticulum is noted. Bowel is normal in caliber. There is sigmoid
diverticulosis. A short segment of distal descending colon enters
the left lateral abdominal wall hernia.

Vascular/Lymphatic: Aortic atherosclerosis. No enlarged lymph nodes.

Reproductive: Status post hysterectomy. No adnexal masses.

Other: Fat containing right lateral ventral hernia. Left lateral
ventral hernia containing short segment of distal descending colon.

Musculoskeletal: Advanced degenerative changes of the included
spine.
IMPRESSION: Left lateral ventral hernia containing short segment of distal
descending colon. Fat containing right lateral ventral hernia.

Indeterminate 1.5 cm lesion of superior left hepatic lobe.
Nonemergent MRI is recommended.

1 cm nonobstructing right renal calculus.

## 2021-12-01 ENCOUNTER — Encounter: Payer: Self-pay | Admitting: Dermatology

## 2021-12-06 ENCOUNTER — Ambulatory Visit: Payer: Medicare HMO | Admitting: Dermatology

## 2022-01-24 ENCOUNTER — Ambulatory Visit: Payer: Medicare HMO | Admitting: Dermatology

## 2022-01-24 DIAGNOSIS — D692 Other nonthrombocytopenic purpura: Secondary | ICD-10-CM

## 2022-01-24 DIAGNOSIS — L905 Scar conditions and fibrosis of skin: Secondary | ICD-10-CM | POA: Diagnosis not present

## 2022-01-24 DIAGNOSIS — D1800 Hemangioma unspecified site: Secondary | ICD-10-CM

## 2022-01-24 DIAGNOSIS — L820 Inflamed seborrheic keratosis: Secondary | ICD-10-CM

## 2022-01-24 DIAGNOSIS — L578 Other skin changes due to chronic exposure to nonionizing radiation: Secondary | ICD-10-CM

## 2022-01-24 DIAGNOSIS — D229 Melanocytic nevi, unspecified: Secondary | ICD-10-CM

## 2022-01-24 DIAGNOSIS — Z1283 Encounter for screening for malignant neoplasm of skin: Secondary | ICD-10-CM | POA: Diagnosis not present

## 2022-01-24 DIAGNOSIS — L814 Other melanin hyperpigmentation: Secondary | ICD-10-CM

## 2022-01-24 DIAGNOSIS — L821 Other seborrheic keratosis: Secondary | ICD-10-CM

## 2022-01-24 DIAGNOSIS — L570 Actinic keratosis: Secondary | ICD-10-CM | POA: Diagnosis not present

## 2022-01-24 NOTE — Progress Notes (Signed)
Follow-Up Visit   Subjective  Gabriela Cohen is a 76 y.o. female who presents for the following: Annual Exam (Mole check ). The patient presents for Total-Body Skin Exam (TBSE) for skin cancer screening and mole check.  The patient has spots, moles and lesions to be evaluated, some may be new or changing and the patient has concerns that these could be cancer.   The following portions of the chart were reviewed this encounter and updated as appropriate:   Tobacco  Allergies  Meds  Problems  Med Hx  Surg Hx  Fam Hx     Review of Systems:  No other skin or systemic complaints except as noted in HPI or Assessment and Plan.  Objective  Well appearing patient in no apparent distress; mood and affect are within normal limits.  A full examination was performed including scalp, head, eyes, ears, nose, lips, neck, chest, axillae, abdomen, back, buttocks, bilateral upper extremities, bilateral lower extremities, hands, feet, fingers, toes, fingernails, and toenails. All findings within normal limits unless otherwise noted below.  back x 16 x left neck x 1, body x 12  (32) (32) Stuck-on, waxy, tan-brown papules-- Discussed benign etiology and prognosis.   face,nose x 7 (7) Erythematous thin papules/macules with gritty scale.   left cheek Dyspigmented smooth macule or patch.    Assessment & Plan  Inflamed seborrheic keratosis (32) back x 16 x left neck x 1, body x 12  (32) Reassured benign age-related growth.  Recommend observation.  Discussed cryotherapy if spot(s) become irritated or inflamed.   Destruction of lesion - back x 16 x left neck x 1, body x 12  (32) Complexity: simple   Destruction method: cryotherapy   Informed consent: discussed and consent obtained   Timeout:  patient name, date of birth, surgical site, and procedure verified Lesion destroyed using liquid nitrogen: Yes   Region frozen until ice ball extended beyond lesion: Yes   Outcome: patient tolerated  procedure well with no complications   Post-procedure details: wound care instructions given    AK (actinic keratosis) (7) face,nose x 7 Actinic keratoses are precancerous spots that appear secondary to cumulative UV radiation exposure/sun exposure over time. They are chronic with expected duration over 1 year. A portion of actinic keratoses will progress to squamous cell carcinoma of the skin. It is not possible to reliably predict which spots will progress to skin cancer and so treatment is recommended to prevent development of skin cancer.  Recommend daily broad spectrum sunscreen SPF 30+ to sun-exposed areas, reapply every 2 hours as needed.  Recommend staying in the shade or wearing long sleeves, sun glasses (UVA+UVB protection) and wide brim hats (4-inch brim around the entire circumference of the hat). Call for new or changing lesions.   Destruction of lesion - face,nose x 7 Complexity: simple   Destruction method: cryotherapy   Informed consent: discussed and consent obtained   Timeout:  patient name, date of birth, surgical site, and procedure verified Lesion destroyed using liquid nitrogen: Yes   Region frozen until ice ball extended beyond lesion: Yes   Outcome: patient tolerated procedure well with no complications   Post-procedure details: wound care instructions given    Scar left cheek Recommend cosmetic laser or plastic surgery   Skin cancer screening  Lentigines - Scattered tan macules - Due to sun exposure - Benign-appearing, observe - Recommend daily broad spectrum sunscreen SPF 30+ to sun-exposed areas, reapply every 2 hours as needed. - Call for any  changes  Seborrheic Keratoses - Stuck-on, waxy, tan-brown papules and/or plaques  - Benign-appearing - Discussed benign etiology and prognosis. - Observe - Call for any changes  Melanocytic Nevi - Tan-brown and/or pink-flesh-colored symmetric macules and papules - Benign appearing on exam today -  Observation - Call clinic for new or changing moles - Recommend daily use of broad spectrum spf 30+ sunscreen to sun-exposed areas.   Hemangiomas - Red papules - Discussed benign nature - Observe - Call for any changes  Actinic Damage - Chronic condition, secondary to cumulative UV/sun exposure - diffuse scaly erythematous macules with underlying dyspigmentation - Recommend daily broad spectrum sunscreen SPF 30+ to sun-exposed areas, reapply every 2 hours as needed.  - Staying in the shade or wearing long sleeves, sun glasses (UVA+UVB protection) and wide brim hats (4-inch brim around the entire circumference of the hat) are also recommended for sun protection.  - Call for new or changing lesions.  Purpura - Chronic; persistent and recurrent.  Treatable, but not curable. Arms  - Violaceous macules and patches - Benign - Related to trauma, age, sun damage and/or use of blood thinners, chronic use of topical and/or oral steroids - Observe - Can use OTC arnica containing moisturizer such as Dermend Bruise Formula if desired - Call for worsening or other concerns   Skin cancer screening performed today.   Return in about 1 year (around 01/25/2023) for TBSE, hx of AKs, hx of ISKs.  IMarye Round, CMA, am acting as scribe for Sarina Ser, MD .  Documentation: I have reviewed the above documentation for accuracy and completeness, and I agree with the above.  Sarina Ser, MD

## 2022-01-24 NOTE — Patient Instructions (Addendum)

## 2022-01-30 ENCOUNTER — Encounter: Payer: Self-pay | Admitting: Dermatology

## 2022-06-24 ENCOUNTER — Other Ambulatory Visit: Payer: Self-pay | Admitting: Family Medicine

## 2022-06-24 DIAGNOSIS — Z1231 Encounter for screening mammogram for malignant neoplasm of breast: Secondary | ICD-10-CM

## 2022-07-26 ENCOUNTER — Telehealth: Payer: Self-pay | Admitting: Pharmacy Technician

## 2022-07-26 ENCOUNTER — Other Ambulatory Visit: Payer: Self-pay

## 2022-07-26 DIAGNOSIS — M810 Age-related osteoporosis without current pathological fracture: Secondary | ICD-10-CM | POA: Insufficient documentation

## 2022-07-26 NOTE — Telephone Encounter (Addendum)
Auth Submission: NO AUTH NEEDED Payer: [employer] Medication & CPT/J Code(s) submitted: Reclast (Zolendronic acid) [id number] Route of submission (phone, fax, portal):  Phone # Fax # Auth type: Buy/Bill Units/visits requested: X1 Reference number:  Approval from: 07/26/22 to 09/04/22  Medicare coverage reviewed and auth approval extended to 09/05/23

## 2022-08-09 ENCOUNTER — Other Ambulatory Visit: Payer: Self-pay

## 2022-08-25 ENCOUNTER — Ambulatory Visit: Payer: Medicare HMO

## 2022-09-01 ENCOUNTER — Ambulatory Visit
Admission: RE | Admit: 2022-09-01 | Discharge: 2022-09-01 | Disposition: A | Discharge status: Home / Self Care | Payer: Medicare HMO | Source: Ambulatory Visit | Attending: Family Medicine | Admitting: Family Medicine

## 2022-09-01 DIAGNOSIS — Z1231 Encounter for screening mammogram for malignant neoplasm of breast: Secondary | ICD-10-CM | POA: Diagnosis present

## 2022-09-07 ENCOUNTER — Other Ambulatory Visit: Payer: Self-pay | Admitting: Pharmacy Technician

## 2022-09-13 ENCOUNTER — Other Ambulatory Visit: Payer: Self-pay

## 2022-09-28 ENCOUNTER — Encounter (HOSPITAL_COMMUNITY): Admission: RE | Admit: 2022-09-28 | Payer: Medicare HMO | Source: Ambulatory Visit

## 2022-10-26 ENCOUNTER — Encounter (HOSPITAL_COMMUNITY)
Admission: RE | Admit: 2022-10-26 | Discharge: 2022-10-26 | Disposition: A | Discharge status: Home / Self Care | Payer: Medicare HMO | Source: Ambulatory Visit | Attending: Family Medicine | Admitting: Family Medicine

## 2022-10-26 VITALS — BP 162/57 | HR 63 | Temp 97.9°F | Resp 16

## 2022-10-26 DIAGNOSIS — M810 Age-related osteoporosis without current pathological fracture: Secondary | ICD-10-CM | POA: Diagnosis present

## 2022-10-26 MED ORDER — DIPHENHYDRAMINE HCL 25 MG PO CAPS
25.0000 mg | ORAL_CAPSULE | Freq: Once | ORAL | Status: AC
  Administered 2022-10-26: 25 mg via ORAL
  Filled 2022-10-26: qty 1

## 2022-10-26 MED ORDER — ZOLEDRONIC ACID 5 MG/100ML IV SOLN
5.0000 mg | Freq: Once | INTRAVENOUS | Status: AC
  Administered 2022-10-26: 5 mg via INTRAVENOUS
  Filled 2022-10-26: qty 100

## 2022-10-26 MED ORDER — ACETAMINOPHEN 325 MG PO TABS
650.0000 mg | ORAL_TABLET | Freq: Once | ORAL | Status: AC
  Administered 2022-10-26: 650 mg via ORAL
  Filled 2022-10-26: qty 2

## 2022-10-26 NOTE — Progress Notes (Signed)
Diagnosis: Osteoporosis  Provider:   Mina Marble DO  Procedure: Infusion  IV Type: Peripheral, IV Location: R Forearm  Reclast (Zolendronic Acid), Dose: 5 mg  Infusion Start Time: [id number]  Infusion Stop Time: 1117  Post Infusion IV Care: Observation period completed and Peripheral IV Discontinued  Discharge: Condition: Good, Destination: Home . AVS Provided  Performed by:  Baxter Hire, RN

## 2022-11-10 ENCOUNTER — Ambulatory Visit: Payer: Medicare HMO | Admitting: Dermatology

## 2022-11-30 ENCOUNTER — Ambulatory Visit: Payer: Medicare HMO | Admitting: Dermatology

## 2022-12-15 ENCOUNTER — Ambulatory Visit: Payer: Medicare HMO | Admitting: Dermatology

## 2022-12-15 VITALS — BP 146/79 | HR 72

## 2022-12-15 DIAGNOSIS — L820 Inflamed seborrheic keratosis: Secondary | ICD-10-CM

## 2022-12-15 DIAGNOSIS — T148XXA Other injury of unspecified body region, initial encounter: Secondary | ICD-10-CM

## 2022-12-15 DIAGNOSIS — L570 Actinic keratosis: Secondary | ICD-10-CM | POA: Diagnosis not present

## 2022-12-15 DIAGNOSIS — Z1283 Encounter for screening for malignant neoplasm of skin: Secondary | ICD-10-CM

## 2022-12-15 DIAGNOSIS — Z79899 Other long term (current) drug therapy: Secondary | ICD-10-CM

## 2022-12-15 DIAGNOSIS — L814 Other melanin hyperpigmentation: Secondary | ICD-10-CM

## 2022-12-15 DIAGNOSIS — L821 Other seborrheic keratosis: Secondary | ICD-10-CM

## 2022-12-15 DIAGNOSIS — L578 Other skin changes due to chronic exposure to nonionizing radiation: Secondary | ICD-10-CM | POA: Diagnosis not present

## 2022-12-15 DIAGNOSIS — D692 Other nonthrombocytopenic purpura: Secondary | ICD-10-CM

## 2022-12-15 DIAGNOSIS — S80812A Abrasion, left lower leg, initial encounter: Secondary | ICD-10-CM

## 2022-12-15 MED ORDER — MUPIROCIN 2 % EX OINT: TOPICAL_OINTMENT | CUTANEOUS | 0 refills | Status: AC

## 2022-12-15 NOTE — Patient Instructions (Addendum)
Gentle Skin Care Guide  1. Bathe no more than once a day.  2. Avoid bathing in hot water  3. Use a mild soap like Dove, Vanicream, Cetaphil, CeraVe. Can use Lever 2000 or Cetaphil antibacterial soap  4. Use soap only where you need it. On most days, use it under your arms, between your legs, and on your feet. Let the water rinse other areas unless visibly dirty.  5. When you get out of the bath/shower, use a towel to gently blot your skin dry, don't rub it.  6. While your skin is still a little damp, apply a moisturizing cream such Amlactin rapid relief .  7. Reapply moisturizer any time you start to itch or feel dry.  8. Sometimes using free and clear laundry detergents can be helpful. Fabric softener sheets should be avoided. Downy Free & Gentle liquid, or any liquid fabric softener that is free of dyes and perfumes, it acceptable to use  9. If your doctor has given you prescription creams you may apply moisturizers over them            Cryotherapy Aftercare  Wash gently with soap and water everyday.   Apply Vaseline and Band-Aid daily until healed.     Due to recent changes in healthcare laws, you may see results of your pathology and/or laboratory studies on MyChart before the doctors have had a chance to review them. We understand that in some cases there may be results that are confusing or concerning to you. Please understand that not all results are received at the same time and often the doctors may need to interpret multiple results in order to provide you with the best plan of care or course of treatment. Therefore, we ask that you please give [place] 2 business days to thoroughly review all your results before contacting the office for clarification. Should we see a critical lab result, you will be contacted sooner.   If You Need Anything After Your Visit  If you have any questions or concerns for your doctor, please call our main line at [phone] and press option  4 to reach your doctor's medical assistant. If no one answers, please leave a voicemail as directed and we will return your call as soon as possible. Messages left after 4 pm will be answered the following business day.   You may also send [place] a message via MyChart. We typically respond to MyChart messages within 1-2 business days.  For prescription refills, please ask your pharmacy to contact our office. Our fax number is [phone].  If you have an urgent issue when the clinic is closed that cannot wait until the next business day, you can page your doctor at the number below.    Please note that while we do our best to be available for urgent issues outside of office hours, we are not available 24/7.   If you have an urgent issue and are unable to reach [place], you may choose to seek medical care at your doctor's office, retail clinic, urgent care center, or emergency room.  If you have a medical emergency, please immediately call 911 or go to the emergency department.  Pager Numbers  - Dr. Gwen Pounds: [phone]  - Dr. Neale Burly: [phone]  - Dr. Roseanne Reno: [phone]  In the event of inclement weather, please call our main line at [phone] for an update on the status of any delays or closures.  Dermatology Medication Tips: Please keep the boxes that topical medications come  in in order to help keep track of the instructions about where and how to use these. Pharmacies typically print the medication instructions only on the boxes and not directly on the medication tubes.   If your medication is too expensive, please contact our office at [phone] option 4 or send us a message through MyChart.   We are unable to tell what your co-pay for medications will be in advance as this is different depending on your insurance coverage. However, we may be able to find a substitute medication at lower cost or fill out paperwork to get insurance to cover a needed medication.   If a prior  authorization is required to get your medication covered by your insurance company, please allow us 1-2 business days to complete this process.  Drug prices often vary depending on where the prescription is filled and some pharmacies may offer cheaper prices.  The website www.goodrx.com contains coupons for medications through different pharmacies. The prices here do not account for what the cost may be with help from insurance (it may be cheaper with your insurance), but the website can give you the price if you did not use any insurance.  - You can print the associated coupon and take it with your prescription to the pharmacy.  - You may also stop by our office during regular business hours and pick up a GoodRx coupon card.  - If you need your prescription sent electronically to a different pharmacy, notify our office through [facility] MyChart or by phone at [phone] option 4.     Si Usted Necesita Algo Despus de Su Visita  Tambin puede enviarnos un mensaje a travs de Clinical cytogeneticistMyChart. Por lo general respondemos a los mensajes de MyChart en el transcurso de 1 a 2 das hbiles.  Para renovar recetas, por favor pida a su farmacia que se ponga en contacto con nuestra oficina. Annie SableNuestro nmero de fax es [place] [phone].  Si tiene un asunto urgente cuando la clnica est cerrada y que no puede esperar hasta el siguiente da hbil, puede llamar/localizar a su doctor(a) al nmero que aparece a continuacin.   Por favor, tenga en cuenta que aunque hacemos todo lo posible para estar disponibles para asuntos urgentes fuera del horario de [place]icina, no estamos disponibles las 24 horas del da, los 7 [address]  [address]das de la [place].   Si tiene un problema urgente y no puede comunicarse con nosotros, puede optar por buscar atencin mdica  en el consultorio de su doctor(a), en una clnica privada, en un centro de atencin urgente o en una sala de emergencias.  Si tiene Engineer, drillinguna emergencia mdica, por favor llame  inmediatamente al 911 o vaya a la sala de emergencias.  Nmeros de bper  - Dr. Gwen PoundsKowalski: [phone]  - Dra. Moye: [phone]  - Dra. Roseanne RenoStewart: [phone]  En caso de inclemencias del [place], por favor llame a Lacy Duverneynuestra lnea principal al [phone] para una actualizacin sobre el [place]est[place] de cualquier retraso o cierre.  Consejos para la medicacin en dermatologa: Por favor, guarde las cajas en las que vienen los medicamentos de uso tpico para ayudarle a seguir las instrucciones sobre dnde y cmo usarlos. Las farmacias generalmente imprimen las instrucciones del medicamento slo en las cajas y no directamente en los tubos del Whitesboromedicamento.   Si su medicamento es muy caro, por favor, pngase en contacto con Rolm Galanuestra oficina llamando al [phone] y presione la opcin 4 o envenos un mensaje a travs de Clinical cytogeneticistMyChart.   No podemos decirle cul  ser su copago por los medicamentos por adelantado ya que esto es diferente dependiendo de la cobertura de su seguro. Sin embargo, es posible que podamos encontrar un medicamento sustituto a Audiological scientist un formulario para que el seguro cubra el medicamento que se considera necesario.   Si se requiere una autorizacin previa para que su compaa de seguros [place] su medicamento, por favor permtanos de 1 a 2 das hbiles para completar [address]  Los precios de los medicamentos varan con frecuencia dependiendo del Environmental consultant de dnde se surte la receta y alguna farmacias pueden ofrecer precios ms baratos.  El sitio web www.goodrx.com tiene cupones para medicamentos de Health and safety inspector. Los precios aqu no tienen en cuenta lo que podra costar con la ayuda del seguro (puede ser ms barato con su seguro), pero el sitio web puede darle el precio si no utiliz Tourist information centre manager.  - Puede imprimir el cupn correspondiente y llevarlo con su receta a la farmacia.  - Tambin puede pasar por nuestra oficina durante el horario de atencin regular y Education officer, museum  una tarjeta de cupones de GoodRx.  - Si necesita que su receta se enve electrnicamente a una farmacia diferente, informe a nuestra oficina a travs de MyChart de [place] o por telfono llamando al [phone] y presione la opcin 4.

## 2022-12-15 NOTE — Progress Notes (Signed)
Follow-Up Visit   Subjective  Gabriela Cohen is a 77 y.o. female who presents for the following: Skin Cancer Screening and Full Body Skin Exam, hx of precancers. The patient presents for Total-Body Skin Exam (TBSE) for skin cancer screening and mole check. The patient has spots, moles and lesions to be evaluated, some may be new or changing and the patient has concerns that these could be cancer.  The following portions of the chart were reviewed this encounter and updated as appropriate: medications, allergies, medical history  Review of Systems:  No other skin or systemic complaints except as noted in HPI or Assessment and Plan.  Objective  Well appearing patient in no apparent distress; mood and affect are within normal limits.  A full examination was performed including scalp, head, eyes, ears, nose, lips, neck, chest, axillae, abdomen, back, buttocks, bilateral upper extremities, bilateral lower extremities, hands, feet, fingers, toes, fingernails, and toenails. All findings within normal limits unless otherwise noted below.   Relevant physical exam findings are noted in the Assessment and Plan.   Assessment & Plan   LENTIGINES, SEBORRHEIC KERATOSES, HEMANGIOMAS - Benign normal skin lesions - Benign-appearing - Call for any changes  MELANOCYTIC NEVI - Tan-brown and/or pink-flesh-colored symmetric macules and papules - Benign appearing on exam today - Observation - Call clinic for new or changing moles - Recommend daily use of broad spectrum spf 30+ sunscreen to sun-exposed areas.   ACTINIC DAMAGE - Chronic condition, secondary to cumulative UV/sun exposure - diffuse scaly erythematous macules with underlying dyspigmentation - Recommend daily broad spectrum sunscreen SPF 30+ to sun-exposed areas, reapply every 2 hours as needed.  - Staying in the shade or wearing long sleeves, sun glasses (UVA+UVB protection) and wide brim hats (4-inch brim around the entire circumference  of the hat) are also recommended for sun protection.  - Call for new or changing lesions.  Purpura - Chronic; persistent and recurrent.  Treatable, but not curable. - Violaceous macules and patches - Benign - Related to trauma, age, sun damage and/or use of blood thinners, chronic use of topical and/or oral steroids - Observe - Can use OTC arnica containing moisturizer such as Dermend Bruise Formula if desired - Call for worsening or other concerns   ABRASION  Location: left leg Exam: erythematous patch   Start Mupirocin ointment apply qd-bid   ACTINIC KERATOSIS Exam: Erythematous thin papules/macules with gritty scale  Actinic keratoses are precancerous spots that appear secondary to cumulative UV radiation exposure/sun exposure over time. They are chronic with expected duration over 1 year. A portion of actinic keratoses will progress to squamous cell carcinoma of the skin. It is not possible to reliably predict which spots will progress to skin cancer and so treatment is recommended to prevent development of skin cancer.  Recommend daily broad spectrum sunscreen SPF 30+ to sun-exposed areas, reapply every 2 hours as needed.  Recommend staying in the shade or wearing long sleeves, sun glasses (UVA+UVB protection) and wide brim hats (4-inch brim around the entire circumference of the hat). Call for new or changing lesions.  Treatment Plan:  Prior to procedure, discussed risks of blister formation, small wound, skin dyspigmentation, or rare scar following cryotherapy. Recommend Vaseline ointment to treated areas while healing.  Destruction Procedure Note Destruction method: cryotherapy   Informed consent: discussed and consent obtained   Lesion destroyed using liquid nitrogen: Yes   Outcome: patient tolerated procedure well with no complications   Post-procedure details: wound care instructions given  Locations: nose x 6, left cheek x 1, right hand x 5  # of Lesions Treated:  12  INFLAMED SEBORRHEIC KERATOSIS Exam: Erythematous keratotic or waxy stuck-on papule or plaque.  Symptomatic, irritating, patient would like treated.  Benign-appearing.  Call clinic for new or changing lesions.   Prior to procedure, discussed risks of blister formation, small wound, skin dyspigmentation, or rare scar following treatment. Recommend Vaseline ointment to treated areas while healing.  Destruction Procedure Note Destruction method: cryotherapy   Informed consent: discussed and consent obtained   Lesion destroyed using liquid nitrogen: Yes   Outcome: patient tolerated procedure well with no complications   Post-procedure details: wound care instructions given   Locations: legs,arms, trunk   # of Lesions Treated: 20  SKIN CANCER SCREENING PERFORMED TODAY.   Return in about 6 months (around 06/16/2023) for  hx of AKs, ISKs.  IAngelique Holm, CMA, am acting as scribe for Armida Sans, MD .   Documentation: I have reviewed the above documentation for accuracy and completeness, and I agree with the above.  Armida Sans, MD

## 2023-01-01 ENCOUNTER — Encounter: Payer: Self-pay | Admitting: Dermatology

## 2023-03-22 ENCOUNTER — Other Ambulatory Visit: Payer: Self-pay

## 2023-06-28 ENCOUNTER — Other Ambulatory Visit: Payer: Self-pay | Admitting: Family Medicine

## 2023-06-28 DIAGNOSIS — Z1231 Encounter for screening mammogram for malignant neoplasm of breast: Secondary | ICD-10-CM

## 2023-09-04 ENCOUNTER — Ambulatory Visit: Payer: Medicare HMO

## 2023-09-08 ENCOUNTER — Ambulatory Visit
Admission: RE | Admit: 2023-09-08 | Discharge: 2023-09-08 | Disposition: A | Discharge status: Home / Self Care | Payer: Medicare HMO | Source: Ambulatory Visit | Attending: Family Medicine | Admitting: Family Medicine

## 2023-09-08 DIAGNOSIS — Z1231 Encounter for screening mammogram for malignant neoplasm of breast: Secondary | ICD-10-CM | POA: Insufficient documentation

## 2023-09-12 ENCOUNTER — Ambulatory Visit: Payer: Medicare HMO

## 2023-10-04 ENCOUNTER — Other Ambulatory Visit: Payer: Self-pay | Admitting: Emergency Medicine

## 2023-10-12 ENCOUNTER — Other Ambulatory Visit: Payer: Self-pay

## 2023-10-13 ENCOUNTER — Telehealth: Payer: Self-pay

## 2023-10-13 NOTE — Telephone Encounter (Signed)
 Auth Submission: APPROVED Site of care: Site of care: [facility] Payer: humana  Medication & CPT/J Code(s) submitted: Reclast  (Zolendronic acid) [id number] Route of submission (phone, fax, portal): portal Phone # Fax # Auth type: Buy/Bill PB Units/visits requested: 1 dose Reference number: [id number] Approval from: 10/13/23 to 09/04/24

## 2023-10-19 ENCOUNTER — Ambulatory Visit: Payer: Medicare HMO | Admitting: Dermatology

## 2023-10-30 ENCOUNTER — Encounter (HOSPITAL_COMMUNITY): Payer: Medicare HMO

## 2023-11-01 ENCOUNTER — Encounter (HOSPITAL_COMMUNITY): Admission: RE | Admit: 2023-11-01 | Payer: Medicare HMO | Source: Ambulatory Visit

## 2023-11-01 ENCOUNTER — Encounter (HOSPITAL_COMMUNITY): Payer: Medicare HMO | Attending: Family Medicine | Admitting: Emergency Medicine

## 2023-11-01 VITALS — BP 173/69 | HR 73 | Temp 98.2°F | Resp 18

## 2023-11-01 DIAGNOSIS — M810 Age-related osteoporosis without current pathological fracture: Secondary | ICD-10-CM | POA: Diagnosis not present

## 2023-11-01 MED ORDER — DIPHENHYDRAMINE HCL 25 MG PO CAPS: 25.0000 mg | ORAL_CAPSULE | Freq: Once | ORAL | Status: DC

## 2023-11-01 MED ORDER — ACETAMINOPHEN 325 MG PO TABS: 650.0000 mg | ORAL_TABLET | Freq: Once | ORAL | Status: DC

## 2023-11-01 MED ORDER — ZOLEDRONIC ACID 5 MG/100ML IV SOLN
5.0000 mg | Freq: Once | INTRAVENOUS | Status: AC
  Administered 2023-11-01: 5 mg via INTRAVENOUS

## 2023-11-01 NOTE — Progress Notes (Signed)
 Diagnosis: Osteoporosis  Provider:  Orpah Cobb DO  Procedure: IV Infusion  IV Type: Peripheral, IV Location: L Antecubital  Reclast (Zolendronic Acid), Dose: 5 mg  Infusion Start Time: 1004  Infusion Stop Time: 1035  Post Infusion IV Care: Peripheral IV Discontinued  Discharge: Condition: Good, Destination: Home . AVS Provided  Performed by:  Arrie Senate, RN

## 2023-11-30 ENCOUNTER — Ambulatory Visit: Payer: Medicare HMO | Admitting: Dermatology

## 2023-12-05 ENCOUNTER — Ambulatory Visit: Admitting: Dermatology

## 2024-01-26 LAB — COLOGUARD: COLOGUARD: NEGATIVE

## 2024-11-06 ENCOUNTER — Ambulatory Visit: Payer: Medicare HMO
